# Patient Record
Sex: Male | Born: 1965 | Race: White | Hispanic: No | Marital: Married | Smoking: Never smoker
Health system: Southern US, Community
[De-identification: ages and names within clinical notes are randomized; demographics above are authoritative.]

## PROBLEM LIST (undated history)

## (undated) DIAGNOSIS — F419 Anxiety disorder, unspecified: Secondary | ICD-10-CM

## (undated) DIAGNOSIS — K219 Gastro-esophageal reflux disease without esophagitis: Secondary | ICD-10-CM

## (undated) DIAGNOSIS — K2270 Barrett's esophagus without dysplasia: Secondary | ICD-10-CM

## (undated) DIAGNOSIS — J45909 Unspecified asthma, uncomplicated: Secondary | ICD-10-CM

## (undated) DIAGNOSIS — I201 Angina pectoris with documented spasm: Secondary | ICD-10-CM

## (undated) DIAGNOSIS — E119 Type 2 diabetes mellitus without complications: Secondary | ICD-10-CM

## (undated) DIAGNOSIS — J302 Other seasonal allergic rhinitis: Secondary | ICD-10-CM

## (undated) DIAGNOSIS — IMO0002 Reserved for concepts with insufficient information to code with codable children: Secondary | ICD-10-CM

## (undated) DIAGNOSIS — F32A Depression, unspecified: Secondary | ICD-10-CM

## (undated) DIAGNOSIS — I2510 Atherosclerotic heart disease of native coronary artery without angina pectoris: Secondary | ICD-10-CM

## (undated) DIAGNOSIS — L988 Other specified disorders of the skin and subcutaneous tissue: Secondary | ICD-10-CM

## (undated) DIAGNOSIS — K449 Diaphragmatic hernia without obstruction or gangrene: Secondary | ICD-10-CM

## (undated) DIAGNOSIS — E669 Obesity, unspecified: Secondary | ICD-10-CM

## (undated) DIAGNOSIS — F329 Major depressive disorder, single episode, unspecified: Secondary | ICD-10-CM

## (undated) DIAGNOSIS — K259 Gastric ulcer, unspecified as acute or chronic, without hemorrhage or perforation: Secondary | ICD-10-CM

## (undated) DIAGNOSIS — G4730 Sleep apnea, unspecified: Secondary | ICD-10-CM

## (undated) DIAGNOSIS — Z8679 Personal history of other diseases of the circulatory system: Secondary | ICD-10-CM

## (undated) DIAGNOSIS — I219 Acute myocardial infarction, unspecified: Secondary | ICD-10-CM

## (undated) DIAGNOSIS — J189 Pneumonia, unspecified organism: Secondary | ICD-10-CM

## (undated) HISTORY — PX: CHOLECYSTECTOMY: SHX55

## (undated) HISTORY — DX: Diaphragmatic hernia without obstruction or gangrene: K44.9

## (undated) HISTORY — DX: Type 2 diabetes mellitus without complications: E11.9

## (undated) HISTORY — DX: Atherosclerotic heart disease of native coronary artery without angina pectoris: I25.10

## (undated) HISTORY — DX: Unspecified asthma, uncomplicated: J45.909

## (undated) HISTORY — DX: Personal history of other diseases of the circulatory system: Z86.79

## (undated) HISTORY — PX: CARDIAC CATHETERIZATION: SHX172

## (undated) HISTORY — DX: Other seasonal allergic rhinitis: J30.2

## (undated) HISTORY — DX: Angina pectoris with documented spasm: I20.1

## (undated) HISTORY — PX: FLEXIBLE SIGMOIDOSCOPY: SHX1649

## (undated) HISTORY — PX: OTHER SURGICAL HISTORY: SHX169

## (undated) HISTORY — PX: CLEFT LIP REPAIR: SUR1164

## (undated) HISTORY — DX: Acute myocardial infarction, unspecified: I21.9

## (undated) HISTORY — DX: Obesity, unspecified: E66.9

## (undated) HISTORY — DX: Anxiety disorder, unspecified: F41.9

## (undated) HISTORY — DX: Pneumonia, unspecified organism: J18.9

## (undated) HISTORY — PX: ESOPHAGOGASTRODUODENOSCOPY: SHX1529

## (undated) HISTORY — DX: Gastric ulcer, unspecified as acute or chronic, without hemorrhage or perforation: K25.9

## (undated) HISTORY — DX: Sleep apnea, unspecified: G47.30

## (undated) HISTORY — DX: Gastro-esophageal reflux disease without esophagitis: K21.9

## (undated) HISTORY — PX: TONSILLECTOMY: SUR1361

## (undated) HISTORY — DX: Other specified disorders of the skin and subcutaneous tissue: L98.8

## (undated) HISTORY — DX: Barrett's esophagus without dysplasia: K22.70

## (undated) HISTORY — DX: Major depressive disorder, single episode, unspecified: F32.9

## (undated) HISTORY — DX: Depression, unspecified: F32.A

## (undated) HISTORY — PX: CLEFT PALATE REPAIR: SUR1165

## (undated) HISTORY — DX: Reserved for concepts with insufficient information to code with codable children: IMO0002

## (undated) HISTORY — DX: Morbid (severe) obesity due to excess calories: E66.01

---

## 2006-11-02 ENCOUNTER — Emergency Department (HOSPITAL_COMMUNITY): Admission: EM | Admit: 2006-11-02 | Discharge: 2006-11-02 | Payer: Self-pay | Admitting: Emergency Medicine

## 2007-09-06 ENCOUNTER — Observation Stay (HOSPITAL_COMMUNITY): Admission: EM | Admit: 2007-09-06 | Discharge: 2007-09-07 | Payer: Self-pay | Admitting: Emergency Medicine

## 2007-09-06 ENCOUNTER — Encounter (INDEPENDENT_AMBULATORY_CARE_PROVIDER_SITE_OTHER): Payer: Self-pay | Admitting: Emergency Medicine

## 2007-09-06 ENCOUNTER — Ambulatory Visit: Payer: Self-pay | Admitting: Internal Medicine

## 2007-09-13 ENCOUNTER — Ambulatory Visit (HOSPITAL_COMMUNITY): Admission: RE | Admit: 2007-09-13 | Discharge: 2007-09-13 | Payer: Self-pay | Admitting: *Deleted

## 2007-10-20 ENCOUNTER — Observation Stay (HOSPITAL_COMMUNITY): Admission: EM | Admit: 2007-10-20 | Discharge: 2007-10-21 | Payer: Self-pay | Admitting: Emergency Medicine

## 2009-10-19 IMAGING — CR DG CHEST 1V PORT
1 series · 1 of 1 positions shown · non-contrast
Comparison: None.

CLINICAL DATA: Chest pain.  Short of breath.
 PORTABLE CHEST, ONE VIEW ? 09/06/2007 ? (1666 HOURS):

[view not recorded]
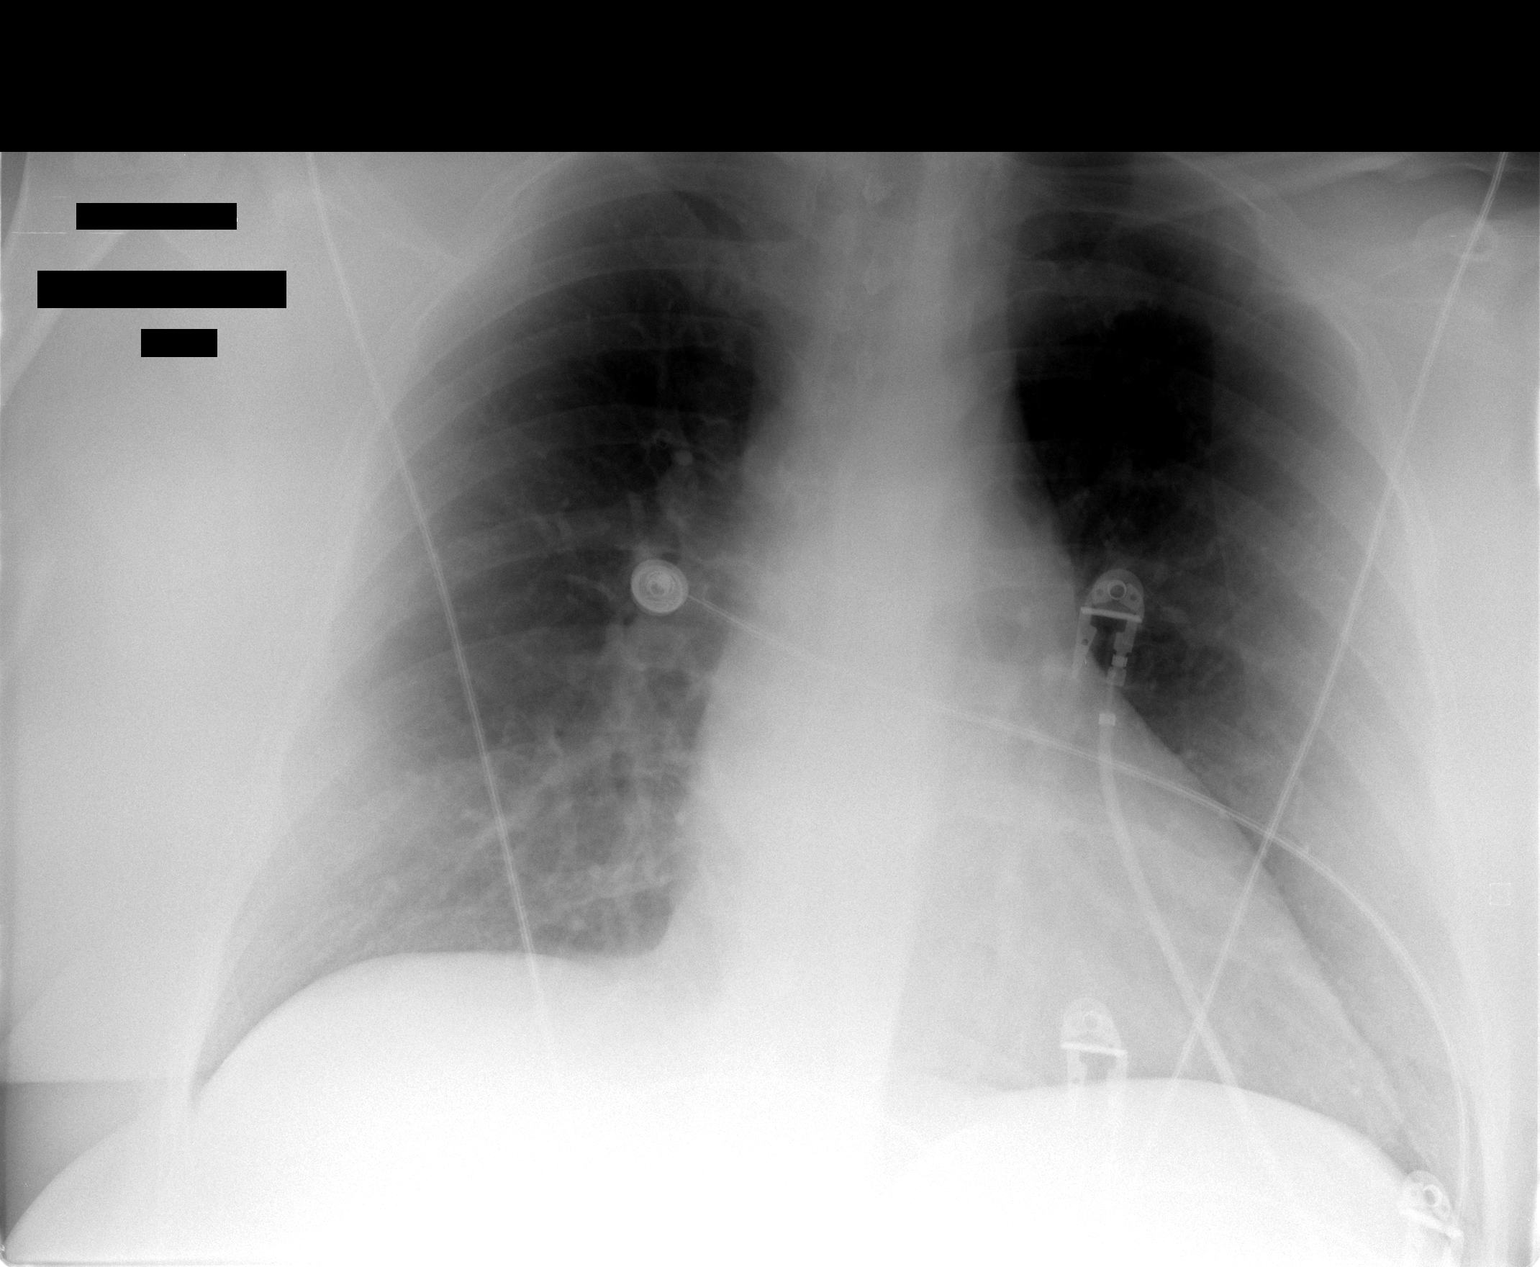

[1 of 1 positions shown; findings below may reference images not displayed]

FINDINGS: The heart is upper normal in size.  There is no heart failure and the lungs are clear.  There is no infiltrate or effusion.
IMPRESSION: Cardiac enlargement.  No active cardiopulmonary disease.

## 2011-03-15 NOTE — Discharge Summary (Signed)
NAME:  Henry Vasquez, Henry Vasquez             ACCOUNT NO.:  [id number]   MEDICAL RECORD NO.:  [id number]          PATIENT TYPE:  INP   LOCATION:  3731                         FACILITY:  [facility]   PHYSICIAN:  Vesta Mixer, M.D. DATE OF BIRTH:  03/06/1966   DATE OF ADMISSION:  10/20/2007  DATE OF DISCHARGE:  10/21/2007                               DISCHARGE SUMMARY   DISCHARGE DIAGNOSES:  1. Acute pericarditis.  2. History of Prinzmetal's angina.  3. Diabetes mellitus.  4. Morbid obesity.   DISCHARGE MEDICATIONS:  1. Aspirin 162 mg a day.  2. Motrin 400 mg 2-3 times a day.  3. Colchicine 0.6 mg twice a day as needed.  4. Actos 30 mg a day.  5. Lantus 65 units subcutaneously at night.  6. Lexapro 40 mg a day.  7. Lisinopril 20 mg a day.  8. Glucophage 1000 mg p.o. b.i.d.  9. Imdur 60 mg a day.  10.Verapamil SR 120 mg a day.   DISPOSITION:  The patient will see Dr. Reyes Ivan this week.   HISTORY:  Mr. Harriott is a 45 year old gentleman with recent onset of  chest pain.  He presented to the emergency room with more chest pain  last night.  Please please see dictated H&P for further details.   HOSPITAL COURSE BY PROBLEM:  Problem 1.  Chest pain.  The patient was  recently diagnosed as having Prinzmetal's angina.  He has been treated  with Imdur and was recently started on verapamil, but his chest pain has  continued.  He presented to the ER last night where was found to have PR  depression with ST-segment elevation in a diffuse manner consistent with  pericarditis.  He was started on colchicine and Motrin, with resolution  of his chest pain.  This morning, his EKG is improved but still shows a  little bit of ST-segment elevation.  He will be discharged on the above-  noted medications and will follow up with Dr. Reyes Ivan.  He will need a  standard evaluation for pericarditis including workup for collagen  vascular disease.  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  Further workup per Dr. Reyes Ivan.          ______________________________  Vesta Mixer, M.D.    PJN/MEDQ  D:  10/21/2007  T:  10/22/2007  Job:  [id number]   cc:   Elmore Guise., M.D.

## 2011-03-15 NOTE — H&P (Signed)
NAME:  Henry Vasquez, BUTNER NO.:  [id number]   MEDICAL RECORD NO.:  [id number]          PATIENT TYPE:  INP   LOCATION:  3704                         FACILITY:  [facility]   PHYSICIAN:  Elmore Guise., M.D.DATE OF BIRTH:  Oct 17, 1966   DATE OF ADMISSION:  09/06/2007  DATE OF DISCHARGE:  09/07/2007                              HISTORY & PHYSICAL   DATE OF PROCEDURE:  Thursday, November 13th.   PROCEDURE:  Cardiac catheterization with possible intervention.   INDICATION FOR PROCEDURE:  Exertional chest pain and shortness of  breath.   HISTORY OF PRESENT ILLNESS:  Mr. Heggs is a very pleasant 45 year old  white male, past medical history of diabetes mellitus, hypertension,  obesity, who was admitted to the hospital on November 6th for chest  pain.  At that time, he had an abnormal EKG and felt to have  pericarditis.  He was treated with ibuprofen over the weekend, however,  his symptoms have continued.  He now reports increasing exertional  dyspnea.  He can walk less than a block before he gets totally winded  and tired.  He now states his chest discomfort  is a pressure.  It is  worse with activity, improve with rest.  Denies any positional changes.  While he was admitted to the hospital, he did get nitroglycerin with  improvement in his symptoms.  His pain is left-sided, associated with  shortness of breath and diaphoresis, but no nausea.  His last cardiac  workup was at [facility] approximately 6 years ago, and at that  time checked out okay.  With his last hospital admission, he was ruled  out and told to follow up for further evaluation.  Over the weekend he  did well, however, he continued with his exertional symptoms.  He denies  any recent fever or cough.  No malaise.  No problems with recent virus  or infection.  He states his sugar has been running a little bit on the  higher side.  He has had an approximately 5-pound or 10-pound weight  gain because  he can not do the activities he would like to do.  He does  work as a Doctor, general practice at [employer].  He can walk from his car to  the front door, and he will be significantly short of breath.  However,  now, when he walks from his car to the front door, not only will he have  shortness of breath, but also chest discomfort.  On arrival to his  office, he will be totally out of breath with chest discomfort and has  to sit down for 5 or 10 minutes before he is able to resume his  activities.   REVIEW OF SYSTEMS:  Are as per HPI.  All others are negative.   CURRENT MEDICATIONS:  1. Lantus 65 units nightly.  2. Metformin 1000 mg twice daily.  3. Actos 30 mg daily.  4. Lisinopril 20 mg daily.  5. Lexapro 40 mg daily.  6. Nexium 40 mg daily.  7. Aspirin 81 mg daily.  8. Ibuprofen p.r.n.   ALLERGIES:  None.  FAMILY HISTORY:  Positive for heart disease with his grandmother and  grandfather.  His mother has hypertension.  His father's family history  is unknown.   SOCIAL HISTORY:  He is married.  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  No tobacco, rare alcohol intake, and occasional caffeine intake.   PAST SURGICAL HISTORY:  1. Cleft palate repair.  2. Tonsillectomy.   PHYSICAL EXAMINATION:  8is weight is 280 pounds, blood pressure 116/64 .  His heart rate is 88 and regular.  GENERAL:  He is a very pleasant middle-aged white male, alert and  oriented x4.  No acute distress.  HEENT:  Repaired cleft palate.  NECK:  Supple.  No lymphadenopathy.  2+ carotids.  No JVD and no bruits.  LUNGS:  Clear.  HEART:  Regular with normal S1, S2.  Soft flow murmur noted.  ABDOMEN:  Obese, soft, nontender, nondistended.  No rebound or guarding.  EXTREMITIES:  Warm with 2+ pedal pulses and no edema.  Femoral pulse is  trace and deep.   His blood work from his hospitalization was reviewed and showed a white  blood cell count of 7.3, a hemoglobin of 14.7, platelet count of 335.  He had a PT  and INR of  13.1 and 1, with a PTT of 30.  He had a  potassium level 4, a BUN and creatinine of 10 and 0.7.  Normal LFTs.  His cardiac markers were negative.  His lipids showed a total  cholesterol 132, triglycerides 128, HDL 31, LDL 75.  His TSH was normal  at 0.87, and his hemoglobin A1c was 7.2.  His EKG shows normal sinus  rhythm, normal axis, normal intervals with mild J-point elevation  throughout his limb and reported leads.   IMPRESSION:  1. Continued exertional chest tightness consistent with angina.  2. Diabetes mellitus.  3. Borderline hypertension.  4. Dyslipidemia (low HDL).   PLAN:  The patient's symptoms have continued.  He is not able to do his  normal activities because of his significant limitations.  He did have  an echocardiogram done during his last hospitalization which showed an  EF of approximately 60% with no wall motion abnormalities.  He did have  mild LVH.  His diastolic function was not noted.  He had no significant  valvular heart disease.  Because of his symptoms, I have recommended  continued aspirin.  I did write him a prescription for nitroglycerin to  use on a p.r.n. basis.  We will also start Ranexa 500 mg twice daily.  His QT was normal.  He will be set up for cardiac catheterization with  possible intervention.  I have discussed the risks and benefits with him  at length, and he agrees to proceed.  All his questions were answered.      Elmore Guise., M.D.  Electronically Signed     TWK/MEDQ  D:  09/10/2007  T:  09/11/2007  Job:  [id number]

## 2011-03-15 NOTE — H&P (Signed)
NAME:  DYER, KLUG NO.:  [id number]   MEDICAL RECORD NO.:  [id number]          PATIENT TYPE:  INP   LOCATION:  3731                         FACILITY:  [facility]   PHYSICIAN:  Vesta Mixer, M.D. DATE OF BIRTH:  [birth year]   DATE OF ADMISSION:  10/20/2007  DATE OF DISCHARGE:                              HISTORY & PHYSICAL   HISTORY OF PRESENT ILLNESS:  Henry Vasquez is a 45 year old gentleman  with a history of diabetes mellitus and coronary spasm.  He is admitted  with chest pain.   The patient has a history of chest pain for the past several months.  He  had a heart catheterization last month which revealed coronary spasm of  the proximal right after engagement of the right.  It resolved with  sublingual nitroglycerin.  He was seen in the office last week with more  chest pain.  He had Verapamil added.  The patient presents to the  hospital today with persistent chest pain.  It was not completely  relieved with sublingual nitroglycerin.  He is now admitted for further  evaluation.   CURRENT MEDICATIONS:  1. Actos 30 mg a day.  2. Lantus insulin 65 units at night.  3. Lexapro 40 mg a day.  4. Lisinopril 20 mg a day.  5. Metformin 1000 mg p.o. b.i.d.  6. Nexium 40 mg a day.  7. Imdur 60 mg a day.  8. Verapamil SR 120 mg a day.   ALLERGIES:  NO KNOWN DRUG ALLERGIES.   PAST MEDICAL HISTORY:  1. Diabetes mellitus.  2. 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   SOCIAL HISTORY:  The patient is a nonsmoker and nondrinker.  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   FAMILY HISTORY:  Positive for coronary disease in his grandfather.   REVIEW OF SYSTEMS:  The patient denies any significant prolonged  episodes of chest pain.  He denies any syncope or presyncope.  He denies  any shortness breath.   PHYSICAL EXAMINATION:  GENERAL:  He is a middle-aged gentleman in no  acute distress.  He is alert and oriented x3 and his mood and affect are   normal.  VITAL SIGNS:  Blood pressure 113/59 with heart rate of 77.  HEENT:  2+ carotids, no bruits, no JVD, no thyromegaly.  LUNGS:  Clear to auscultation.  HEART:  Regular rate, S1, S2, no rub.  ABDOMEN:  Good bowel sounds and nontender.  EXTREMITIES:  No clubbing, cyanosis or edema.  NEUROLOGIC:  Nonfocal.   LABORATORY DATA AND X-RAY FINDINGS:  Cardiac enzymes are negative x2.  Electrolytes normal.   His EKG reveals normal sinus rhythm.  He has PR segment depression with  ST-segment elevation consistent with pericarditis.   IMPRESSION/PLAN:  Chest pain.  I suspect the patient has acute  pericarditis.  Will give him some colchicine as well as some Motrin.  Will keep him in the hospital since he does have a history of coronary  spasm, but I suspect we will be able to stop the nitroglycerin soon.  We  will add nonsteroidal anti-inflammatory agents to his  discharge  medications.  I anticipate sending him home tomorrow.           ______________________________  Vesta Mixer, M.D.     PJN/MEDQ  D:  10/20/2007  T:  10/22/2007  Job:  [id number]

## 2011-03-15 NOTE — Consult Note (Signed)
NAME:  Henry Vasquez, Henry Vasquez NO.:  [id number]   MEDICAL RECORD NO.:  [id number]          PATIENT TYPE:  INP   LOCATION:  3704                         FACILITY:  [facility]   PHYSICIAN:  Cassell Clement, M.D. DATE OF BIRTH:  01/15/1966   DATE OF CONSULTATION:  09/07/2007  DATE OF DISCHARGE:  09/07/2007                                 CONSULTATION   REFERRING PHYSICIAN:  Ileana Roup, M.D.   CHIEF COMPLAINT:  Chest pain.   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  He was admitted on September 06, 2007  because of chest pain.  He was admitted on the Teaching Service.  He  gives a history of longstanding adult-onset diabetes mellitus and a  history of morbid obesity; he weighs in the neighborhood of 285 pounds.  He develop chest discomfort yesterday and he came to the emergency room,  where his initial evaluation was unremarkable, but because of the  history, he was admitted.  He gives a history of having had a cardiac  workup at [facility] approximately 6 years ago including a stress  test, which was negative at that time.   PAST MEDICAL HISTORY:  The patient has a history of:  1. Hypertension.  2. Depression.  3. Diabetes mellitus.   PRESENT MEDICINES AT HOME:  Include Actos, Lantus, Lexapro, lisinopril,  metformin and Nexium.  He is not on statin therapy, but has  intrinsically very low lipid levels.  He is a nonsmoker.   FAMILY HISTORY:  Positive for heart disease in his mother.  Father's  health is unknown.   SOCIAL HISTORY:  Social history reveals that he is married.  He has  never smoked.  He is a Public affairs consultant at [employer].  He lives with  his wife.   LABORATORY AND ACCESSORY CLINICAL DATA:  The initial cardiac workup has  been negative including a two-dimensional echocardiogram done yesterday  which shows overall left ventricular systolic function normal at 60%  with no regional wall  motion abnormalities and with mild to moderate LV  wall thickening, presumably from his essential hypertension.  There was  no evidence of pericardial thickening or pericardial effusion.   His cardiac enzymes have been negative x3.   His electrocardiograms have shown slight diffuse ST elevation of the ST  segment in the anterior and inferior lead suggesting possible mild  pericarditis by EKG.   PHYSICAL EXAMINATION:  VITAL SIGNS:  On exam today, vital signs are  stable.  CHEST:  Clear.  HEART:  Reveals no pericardial rub, no gallop or murmur.  ABDOMEN:  Obese and nontender.  EXTREMITIES:  Show no phlebitis or edema.  He estimates his weight at  about 285.   IMPRESSION:  Possible mild pericarditis.  No evidence for acute  myocardial infarction.   RECOMMENDATIONS:  I believe that this being Friday, it will be okay to  discharge him today on empiric therapy with ibuprofen 400 mg three times  a day with meals and also on an 81-mg aspirin daily and treat him over  the weekend for a possible low-grade pericarditis versus nonspecific  chest wall pain.  We have made him an appointment to see my associate,  Dr. Lady Deutscher, Monday afternoon in the office of 2:15.  He is to be  resting at home until then.           ______________________________  Cassell Clement, M.D.     TB/MEDQ  D:  09/07/2007  T:  09/08/2007  Job:  [id number]   cc:   Ileana Roup, M.D.  Elmore Guise., M.D.

## 2011-03-15 NOTE — Cardiovascular Report (Signed)
NAME:  Henry Vasquez, Henry Vasquez NO.:  [id number]   MEDICAL RECORD NO.:  [id number]          PATIENT TYPE:  OIB   LOCATION:  2853                         FACILITY:  [facility]   PHYSICIAN:  Elmore Guise., M.D.DATE OF BIRTH:  29-Sep-1966   DATE OF PROCEDURE:  09/13/2007  DATE OF DISCHARGE:  09/13/2007                            CARDIAC CATHETERIZATION   INDICATIONS FOR PROCEDURE:  Continued exertional chest pain and  shortness of breath.  Patient with multiple cardiac risk factors,  evaluate for ischemic heart disease.   DESCRIPTION OF PROCEDURE:  The patient was brought to the cardiac cath  lab after appropriate informed consent.  He was prepped and draped in  sterile fashion.  Approximately 15 mL of 1% lidocaine was used for local  anesthesia.  A 5-French sheath was placed in the right femoral artery  with the help of a Smart needle. Coronary angiography and LV angiography  were performed.  The patient was noted to have vasospasm of his proximal  right coronary and this resolved with 200 mcg of intracoronary  nitroglycerin.  The patient tolerated the procedure well and was  transferred from the cardiac cath lab in stable condition.   FINDINGS:  1. Left Main:  Normal.  2. LAD:  Moderate-sized vessel with mild luminal irregularities.  3. D1/D2:  Small vessels with mild luminal irregularities.  4. LC X:  Nondominant giving one large OM vessel both with mild      luminal irregularities.  Ramus intermedius:  Large vessel with mild      luminal irregularities.  RCA:  Dominant, proximal vasospasm noted.      This had total resolution with 200 mcg of intracoronary      nitroglycerin otherwise mild luminal irregularities were noted.  5. LV:  EF is 60%.  No wall motion abnormalities.  LVEDP is 17 mmHg.   IMPRESSION:  1. Nonobstructive coronary arteries with vasospasm of the proximal      right coronary artery with total resolution with 200 mcg of      intracoronary  nitroglycerin.  2. Preserved left ventricular systolic function, ejection fraction of      60% and no wall motion abnormalities.   PLAN:  At this time, I would recommend aggressive risk factor  modification as indicated.  He will have sublingual nitroglycerin for  chest pain and we will continue his Renexa at this time.  Should he have  to take nitroglycerin more than twice per week, we will start long-  acting nitrates.      Elmore Guise., M.D.  Electronically Signed     TWK/MEDQ  D:  09/13/2007  T:  09/13/2007  Job:  [id number]

## 2011-03-18 NOTE — Discharge Summary (Signed)
NAME:  Henry Vasquez, Henry Vasquez NO.:  [id number]   MEDICAL RECORD NO.:  [id number]          PATIENT TYPE:  OBV   LOCATION:  3704                         FACILITY:  [facility]   PHYSICIAN:  Ileana Roup, M.D.  DATE OF BIRTH:  15-Jul-1966   DATE OF ADMISSION:  09/06/2007  DATE OF DISCHARGE:  09/07/2007                               DISCHARGE SUMMARY   DISCHARGE DIAGNOSES:  1. Chest pain.  2. Mild transaminitis.  3. Dyslipidemia to include low HDL.  4. Diabetes.  5. Obesity.  6. Depression.  7. Hypertension.   DISCHARGE MEDICATIONS:  1. Ibuprofen 400 mg t.i.d.  2. Aspirin 81 mg daily.  3. Actos 30 mg daily.  4. Lantus 65 units nightly.  5. Lexapro 20 mg daily.  6. Lisinopril 20 mg daily.  7. Metformin 1000 mg b.i.d.  8. Nexium 40 mg daily.  9. Darvocet-N 100 one tablet every 4 hours, 20 tablets dispensed.   DISPOSITION AND FOLLOW UP:  The patient is to follow up with Dr. Reyes Ivan  on September 10, 2007 at 2:15 p.m.  The patient is also to follow up with  [facility] after discharge and repeat liver enzymes need to be  checked.   PROCEDURES PERFORMED:  An echocardiogram was performed during this  hospitalization and indicative of an ejection fraction of 60%.  There  were no left ventricular regional wall motion abnormalities.  The left  ventricular wall thickness was mildly to moderately increased.   CONSULTATIONS:  Cardiology was consulted secondary to the patient's  chest pain and risk factors.  Dr. Patty Sermons felt like the patient's pain  might be secondary to mild pericarditis and recommended treatment with  ibuprofen t.i.d.   ADMITTING HISTORY AND PHYSICAL:  The patient is a 45 year old gentleman  with a past medical history of diabetes type 2 and obesity who presents  with left-sided chest pain that was associated with shortness of breath.  It lasted approximately 2 minutes and radiated to his left neck.  The  patient states he has been having this pain  off and on for about a week.  The morning of admission was the first time he had pain with rest.  The  pain is not associated with diaphoresis, nausea, vomiting or dizziness.  It usually happens with exertion and it gets better with rest.   ADMISSION VITALS:  Temperature 97.3, blood pressure 127/76, pulse 89,  respiratory rate 20, O2 saturation 97% on room air.   ADMISSION PHYSICAL EXAM:  GENERAL:  The patient is pleasant, alert and  oriented, in no distress.  EYES:  Anicteric, extraocular motions intact and pupils were equally  round and reactive to light.  ENT:  Moist mucous membranes.  Scar from a cleft palate repair noted.  NECK:  Supple, no lymphadenopathy, no carotid bruits.  RESPIRATORY:  Clear to auscultation bilaterally.  CARDIOVASCULAR:  Regular rate and rhythm, no murmurs, rubs or gallops.  Chest pain not reproducible with palpation.  ABDOMEN:  Soft, obese, positive bowel sounds, nontender, nondistended.  EXTREMITIES:  No edema.  NEURO:  Grossly intact.  No cerebellar signs.   ADMISSION  LABORATORY:  Cardiac enzymes within normal limits.  CBC:  White blood cell count 7.3, hemoglobin 14.7, MCV 84.7, RDW 13.5,  platelet count 335.  PT/INR 13.1 and 7, PTT 30.  CMET:  Sodium 140,  potassium 4, chloride 103, bicarb 28, BUN 10, creatinine 0.78, glucose  130, total bilirubin 0.6, alkaline phosphatase 54, AST 45, ALT 71, total  protein 6.5, albumin 3.9, calcium 9.1.  Urine drug screen within normal  limits.   HOSPITAL COURSE:  1. Chest pain.  EKG was done during this hospitalization and cardiac      enzymes were cycled.  Cardiac enzymes remained within normal limits      and an EKG showed some possible ST segment elevation in all leads.      Cardiology was consulted and felt like the patient may have a mild      pericarditis and was started on therapy with ibuprofen 400 mg      t.i.d.  A TSH was also checked during this hospitalization and was      within normal limits at 0.872.   Urine drug screen was negative.  A      lipid profile was checked and the patient's LDL was 75 and HDL was      low at 31.  An echo was done during this hospitalization and      indicative of a normal ejection fraction and no ventricular wall      motion abnormalities.  The left ventricular wall thickness was      mildly to moderately increased.  The patient will follow up with      Dr. Yevonne Pax office as an outpatient.  2. Mild transaminitis.  On admission the patient's liver enzymes were      noted to be mildly elevated with an AST of 45 and ALT of 71.  The      patient will need this repeated and followed up as an outpatient.  3. Diabetes mellitus type 2.  Hemoglobin A1c was checked during this      hospitalization and found to be 7.2.  The patient was placed on      sliding scale insulin and good glycemic control was achieved.  4. Depression.  The patient's home medications were continued and his      mood remained stable during this hospitalization.  5. Dyslipidemia.  A lipid panel was checked and the patient's LDL was      good at 75 however the patient had a low HDL at 31.  The patient      would likely benefit from niacin therapy and Cardiology will follow      this as an outpatient.  6. Hypertension.  The patient was started on lisinopril and was      normotensive throughout this hospitalization.   DISCHARGE VITALS:  Temperature 96.8, blood pressure 112/69, pulse 82,  respiratory rate 20, O2 saturation 96% on 2 liters.   DISCHARGE LABORATORY:  CBC:  White blood cell count 6.7, hemoglobin  13.6, platelet count 303.  BMET:  Sodium 140, potassium 3.9, chloride  103, bicarb 31, BUN 12, creatinine 0.9, glucose 110, and calcium 9.      Joaquin Courts, MD  Electronically Signed      Ileana Roup, M.D.  Electronically Signed    VW/MEDQ  D:  11/12/2007  T:  11/13/2007  Job:  [id number]

## 2011-08-05 LAB — CK TOTAL AND CKMB (NOT AT ARMC)
CK, MB: 0.9
CK, MB: 0.9
Relative Index: INVALID
Total CK: 99

## 2011-08-05 LAB — BASIC METABOLIC PANEL
Calcium: 8.9
Chloride: 105
Creatinine, Ser: 0.99
GFR calc Af Amer: >60
GFR calc non Af Amer: >60
Sodium: 142

## 2011-08-05 LAB — POCT CARDIAC MARKERS
CKMB, poc: <1 — ABNORMAL LOW
Myoglobin, poc: 61.5
Myoglobin, poc: 69

## 2011-08-05 LAB — I-STAT 8, (EC8 V) (CONVERTED LAB)
Acid-Base Excess: 1
BUN: 13
Bicarbonate: 26.7 — ABNORMAL HIGH
Chloride: 104
Hemoglobin: 15
Operator id: 196461
pH, Ven: 7.373 — ABNORMAL HIGH

## 2011-08-05 LAB — CBC
Hemoglobin: 13.6
Hemoglobin: 14.1
MCHC: 33.9
MCV: 85.6
RDW: 13.7

## 2011-08-05 LAB — DIFFERENTIAL
Basophils Absolute: 0.1
Basophils Relative: 1
Lymphocytes Relative: 29
Monocytes Absolute: 0.6
Monocytes Relative: 8
Neutro Abs: 4.7
Neutrophils Relative %: 61

## 2011-08-05 LAB — POCT I-STAT CREATININE: Creatinine, Ser: 0.8

## 2011-08-09 LAB — I-STAT 8, (EC8 V) (CONVERTED LAB)
BUN: 11
HCT: 46
Hemoglobin: 15.6
Operator id: 288831
Sodium: 136
TCO2: 28

## 2011-08-09 LAB — LIPID PANEL
Cholesterol: 132
HDL: 31 — ABNORMAL LOW
LDL Cholesterol: 75
Total CHOL/HDL Ratio: 4.3
Triglycerides: 128

## 2011-08-09 LAB — CK TOTAL AND CKMB (NOT AT ARMC)
Relative Index: INVALID
Total CK: 119

## 2011-08-09 LAB — BASIC METABOLIC PANEL
BUN: 11
CO2: 31
Chloride: 103
Chloride: 105
Creatinine, Ser: 0.89
GFR calc Af Amer: >60
GFR calc non Af Amer: >60
Glucose, Bld: 87
Potassium: 3.7
Potassium: 3.9

## 2011-08-09 LAB — COMPREHENSIVE METABOLIC PANEL
ALT: 71 — ABNORMAL HIGH
Alkaline Phosphatase: 54
BUN: 10
Creatinine, Ser: 0.78
GFR calc Af Amer: >60
Potassium: 4
Sodium: 140
Total Protein: 6.5

## 2011-08-09 LAB — BASIC METABOLIC PANEL WITH GFR
CO2: 29
Calcium: 8.9
GFR calc Af Amer: >60
Sodium: 139

## 2011-08-09 LAB — URINALYSIS, MICROSCOPIC ONLY
Bilirubin Urine: NEGATIVE
Glucose, UA: NEGATIVE
Hgb urine dipstick: NEGATIVE
Protein, ur: NEGATIVE
Urobilinogen, UA: 0.2

## 2011-08-09 LAB — TROPONIN I
Troponin I: 0.01
Troponin I: 0.01
Troponin I: 0.02

## 2011-08-09 LAB — CBC
HCT: 39.5
HCT: 43.5
Hemoglobin: 13.6
Hemoglobin: 14.7
MCHC: 33.9
MCHC: 34.3
MCV: 84.4
MCV: 84.7
Platelets: 335
RBC: 4.69
RDW: 13.5
WBC: 6.7

## 2011-08-09 LAB — HEMOGLOBIN A1C: Hgb A1c MFr Bld: 7.2 — ABNORMAL HIGH

## 2011-08-09 LAB — DIFFERENTIAL
Eosinophils Absolute: 0.1
Lymphocytes Relative: 26
Lymphs Abs: 1.9
Monocytes Absolute: 0.5

## 2011-08-09 LAB — RAPID URINE DRUG SCREEN, HOSP PERFORMED
Amphetamines: NOT DETECTED
Barbiturates: NOT DETECTED
Cocaine: NOT DETECTED
Opiates: NOT DETECTED
Tetrahydrocannabinol: NOT DETECTED

## 2011-08-09 LAB — PROTIME-INR: Prothrombin Time: 13.1

## 2011-08-09 LAB — TSH: TSH: 0.872

## 2011-08-09 LAB — POCT I-STAT CREATININE: Creatinine, Ser: 0.8

## 2011-08-09 LAB — POCT CARDIAC MARKERS
Operator id: 288831
Troponin i, poc: <0.05

## 2011-10-01 DIAGNOSIS — I219 Acute myocardial infarction, unspecified: Secondary | ICD-10-CM

## 2011-10-01 HISTORY — DX: Acute myocardial infarction, unspecified: I21.9

## 2011-10-02 DIAGNOSIS — I219 Acute myocardial infarction, unspecified: Secondary | ICD-10-CM

## 2011-10-02 HISTORY — DX: Acute myocardial infarction, unspecified: I21.9

## 2013-04-09 ENCOUNTER — Ambulatory Visit (INDEPENDENT_AMBULATORY_CARE_PROVIDER_SITE_OTHER)
Payer: No Typology Code available for payment source | Admitting: Student in an Organized Health Care Education/Training Program

## 2013-04-09 ENCOUNTER — Encounter (INDEPENDENT_AMBULATORY_CARE_PROVIDER_SITE_OTHER): Payer: Self-pay | Admitting: Student in an Organized Health Care Education/Training Program

## 2013-04-09 VITALS — BP 119/84 | HR 99 | Temp 98.2°F | Ht 68.0 in | Wt 328.0 lb

## 2013-04-09 MED ORDER — PANTOPRAZOLE SODIUM 40 MG PO TBEC
40.0000 mg | DELAYED_RELEASE_TABLET | Freq: Every day | ORAL | Status: DC
Start: 2013-04-09 — End: 2013-07-05

## 2013-04-09 MED ORDER — INSULIN ASPART 100 UNIT/ML SC SOLN
60.00 [IU] | Freq: Three times a day (TID) | SUBCUTANEOUS | Status: DC
Start: 2013-04-09 — End: 2013-06-30

## 2013-04-09 MED ORDER — NITROGLYCERIN 0.4 MG SL SUBL
0.4000 mg | SUBLINGUAL_TABLET | SUBLINGUAL | Status: DC | PRN
Start: 2013-04-09 — End: 2013-12-27

## 2013-04-09 MED ORDER — METFORMIN HCL 850 MG PO TABS
850.00 mg | ORAL_TABLET | Freq: Three times a day (TID) | ORAL | Status: DC
Start: 2013-04-09 — End: 2013-07-05

## 2013-04-09 MED ORDER — VENLAFAXINE HCL 75 MG PO TABS
75.0000 mg | ORAL_TABLET | Freq: Two times a day (BID) | ORAL | Status: DC
Start: 2013-04-09 — End: 2013-07-05

## 2013-04-09 MED ORDER — ISOSORBIDE MONONITRATE ER 60 MG PO TB24
60.0000 mg | ORAL_TABLET | Freq: Every day | ORAL | Status: DC
Start: 2013-04-09 — End: 2013-07-05

## 2013-04-09 MED ORDER — INSULIN GLARGINE 100 UNIT/ML SC SOLN
SUBCUTANEOUS | Status: DC
Start: 2013-04-09 — End: 2013-06-30

## 2013-04-09 MED ORDER — AMLODIPINE BESYLATE 5 MG PO TABS
5.0000 mg | ORAL_TABLET | Freq: Every day | ORAL | Status: DC
Start: 2013-04-09 — End: 2013-07-16

## 2013-04-09 MED ORDER — ALBUTEROL SULFATE HFA 108 (90 BASE) MCG/ACT IN AERS
2.0000 | INHALATION_SPRAY | Freq: Four times a day (QID) | RESPIRATORY_TRACT | Status: AC | PRN
Start: 2013-04-09 — End: 2014-04-09

## 2013-04-09 MED ORDER — LISINOPRIL 2.5 MG PO TABS
2.5000 mg | ORAL_TABLET | Freq: Every day | ORAL | Status: DC
Start: 2013-04-09 — End: 2013-07-05

## 2013-04-09 NOTE — Progress Notes (Signed)
Subjective:   Brent Palmer is a 47 y.o. male with hypertension.     DM: diagnosed on 2004, taking lantus 110 bid, novolog 20-60 per meal carb count, trying to lose weight, taking metformin 850 mg tid, UTD with eye exam 01/2013, last A1C done on 12/2012 , UTD with vaccines but no records    HLD: taking amlodipine 5 mg qday, Lisinopril 2.5 mg qday    CAD/HTN: taking imdur qday lasix, amlodipine 5 mg qday , had "mild " AMI on 2012 , was seen by cardiology, has had card cath x 2 , was last seen 12/2012, not taking ASA    Pt c/o chest discomfort , burning sensation , has h/o barret esophagus , taking prilosec, sensations are triggered by foodm started few weeks ago    Has cleft palate repaired, has OSA using CPAP   Just moved in to the area, wife works at [employer]       Past Medical History   Diagnosis Date   . Diabetes mellitus    . Depression    . Anxiety    . GERD (gastroesophageal reflux disease)    . Morbid obesity    . Ulcer    . Seasonal allergies    . Asthma without status asthmaticus    . Heart attack 10/02/2011       No past surgical history on file.  History     Social History   . Marital Status: Married     Spouse Name: N/A     Number of Children: N/A   . Years of Education: N/A     Occupational History   . Not on file.     Social History Main Topics   . Smoking status: Not on file   . Smokeless tobacco: Not on file   . Alcohol Use: Not on file   . Drug Use: Not on file   . Sexually Active: Not on file     Other Topics Concern   . Not on file     Social History Narrative   . No narrative on file       Current Outpatient Prescriptions   Medication Sig Dispense Refill   . Albuterol Sulfate (PROAIR HFA IN) Inhale into the lungs.       Marland Kitchen amLODIPine (NORVASC) 5 MG tablet Take 5 mg by mouth daily.       Marland Kitchen dextrose ([employer] GLUCOSE) 40 % Gel Take 15 g by mouth as needed.       . furosemide (LASIX) 20 MG tablet Take 20 mg by mouth 2 (two) times daily.       . insulin aspart (NOVOLOG) 100 UNIT/ML injection Inject 60  Units into the skin 3 (three) times daily before meals.       . insulin glargine (LANTUS) 100 UNIT/ML injection Inject into the skin nightly.       . isosorbide mononitrate (IMDUR) 60 MG 24 hr tablet        . lisinopril (PRINIVIL,ZESTRIL) 2.5 MG tablet Take 2.5 mg by mouth daily.       . metFORMIN (GLUCOPHAGE) 850 MG tablet Take 850 mg by mouth 3 (three) times daily.       . nitroglycerin (NITROSTAT) 0.4 MG SL tablet Place 0.4 mg under the tongue every 5 (five) minutes as needed.       Marland Kitchen omeprazole (PRILOSEC) 20 MG capsule Take 20 mg by mouth daily.       . potassium chloride SA (K-DUR,KLOR-CON) 20  MEQ tablet Take 20 mEq by mouth 2 (two) times daily.       Marland Kitchen venlafaxine (EFFEXOR) 75 MG tablet Take 75 mg by mouth 2 (two) times daily.          Hypertension ROS: taking medications as instructed, no medication side effects noted, no TIA's, no chest pain on exertion, no dyspnea on exertion and no swelling of ankles.   New concerns: refills, establish care .   Constitutional: negative  Eyes: negative  Ears, nose, mouth, throat, and face: negative  Respiratory: negative  Cardiovascular: negative  Gastrointestinal: chest discomfort   Genitourinary:negative  Integument/breast: negative  Hematologic/lymphatic: negative  Musculoskeletal:negative  Neurological: negative  Behavioral/Psych: negative  Endocrine: negative      Objective:   BP 119/84  Pulse 99  Temp 98.2 F (36.8 C) (Oral)  Ht 1.727 m (5\' 8" )  Wt 148.78 kg (328 lb)  BMI 49.88 kg/m2  SpO2 98%   Appearance alert, well appearing, and in no distress.  CONSTITUTIONAL Patient is afebrile, Vital signs reviewed. normotensive.   HEAD Atraumatic, Normocephallc.   EYES Eyes are normal to inspection, PERRL, No discharge from eyes,   ENT throat scarring with cleft palate repaired    NECK Normal ROM, No jugular venous distention, No meningeal signs.   RESPIRATORY CHEST Chest is nontender, Breath sounds normal.   CARDIOVASCULAR RRR, Heart sounds normal, Normal S1 S2.    ABDOMEN Abdomen is nontender, No pulsatile masses, No other masses,   Bowel sounds normal, No distension, No peritoneal signs.   BACK There is no CVA Tenderness, There is no tenderness to palpation.   UPPER EXTREMITY Inspection normal, No cyanosis.   LOWER EXTREMITY Inspection normal, No cyanosis.   NEURO Cranial nerves intact, No cerebellar deficits, Normal DTRs, Babinski absent, Speech normal, Memory normal   SKIN Skin is warm, Skin is dry, Skin is normal color   LYMPHATIC No adenopathy in neck.   PSYCHIATRIC Oriented X 3, Normal affect. Normal insight.       Assessment:    1. DM (diabetes mellitus)  Hemoglobin A1c, Comprehensive metabolic panel, TSH, insulin aspart (NOVOLOG) 100 UNIT/ML injection, insulin glargine (LANTUS) 100 UNIT/ML injection, metFORMIN (GLUCOPHAGE) 850 MG tablet   2. CAD (coronary artery disease)  Lipid panel, TSH, Ambulatory referral to Cardiology, isosorbide mononitrate (IMDUR) 60 MG 24 hr tablet, nitroglycerin (NITROSTAT) 0.4 MG SL tablet   3. HTN (hypertension)  Comprehensive metabolic panel, Lipid panel, TSH, amLODIPine (NORVASC) 5 MG tablet, isosorbide mononitrate (IMDUR) 60 MG 24 hr tablet, lisinopril (PRINIVIL,ZESTRIL) 2.5 MG tablet   4. Barrett esophagus  Ambulatory referral to Gastroenterology, pantoprazole (PROTONIX) 40 MG tablet   5. Asthma, mild intermittent, uncomplicated  albuterol (PROAIR HFA) 108 (90 BASE) MCG/ACT inhaler   6. Depression  venlafaxine (EFFEXOR) 75 MG tablet     Check labs as above   Bring old records  Body mass index is 49.88 kg/(m^2).  Discussed the patient's BMI with him.  The BMI is above average; BMI management plan is completed  The patient is asked to make an attempt to improve diet and exercise patterns to aid in medical management of this problem.  Labs/Imaging: as above  Immunizations: as above  EKG: no acute changes   Eye Exam: UTD   Meds: no change   Monitor blood sugar at home.  Treatment Goals reviewed.   Discussed diabetic and low fat  diet.  Regular aerobic exercise.  FU: 3 months or sooner based on results.  F/u with cardiology, GI ,  referral generated   ASA after Sx get better

## 2013-04-10 ENCOUNTER — Encounter (INDEPENDENT_AMBULATORY_CARE_PROVIDER_SITE_OTHER): Payer: Self-pay | Admitting: Student in an Organized Health Care Education/Training Program

## 2013-04-10 ENCOUNTER — Encounter (INDEPENDENT_AMBULATORY_CARE_PROVIDER_SITE_OTHER): Payer: Self-pay

## 2013-04-10 LAB — COMPREHENSIVE METABOLIC PANEL
ALT: 71 U/L — ABNORMAL HIGH (ref 9–46)
AST (SGOT): 40 U/L (ref 10–40)
Albumin/Globulin Ratio: 1.9 (ref 1.0–2.5)
Albumin: 4.7 G/DL (ref 3.6–5.1)
Alkaline Phosphatase: 79 U/L (ref 40–115)
BUN: 16 MG/DL (ref 7–25)
Bilirubin, Total: 0.6 MG/DL (ref 0.2–1.2)
CO2: 24 mmol/L (ref 19–30)
Calcium: 9.9 MG/DL (ref 8.6–10.3)
Chloride: 101 mmol/L (ref 98–110)
Creatinine: 0.76 mg/dL (ref 0.60–1.35)
EGFR African American: 126 mL/min/{1.73_m2} (ref >=60)
EGFR: 109 mL/min/{1.73_m2} (ref >=60)
Globulin: 2.5 G/DL (ref 1.9–3.7)
Glucose: 118 MG/DL — ABNORMAL HIGH (ref 65–99)
Potassium: 4.9 mmol/L (ref 3.5–5.3)
Protein, Total: 7.2 G/DL (ref 6.1–8.1)
Sodium: 140 mmol/L (ref 135–146)

## 2013-04-10 LAB — LIPID PANEL
Cholesterol / HDL Ratio: 3.1 (ref 0.0–5.0)
Cholesterol: 145 MG/DL (ref 125–200)
HDL: 47 MG/DL (ref >=40)
LDL Calculated: 82 MG/DL (ref <130)
Non HDL Cholesterol (LDL and VLDL): 98 mg/dL
Triglycerides: 79 MG/DL (ref <150)

## 2013-04-10 LAB — TSH: TSH: 1.24 mIU/L (ref 0.40–4.50)

## 2013-04-10 LAB — HEMOGLOBIN A1C: Hemoglobin A1C: 6.9 % — ABNORMAL HIGH (ref <5.7)

## 2013-05-15 ENCOUNTER — Ambulatory Visit (INDEPENDENT_AMBULATORY_CARE_PROVIDER_SITE_OTHER): Payer: No Typology Code available for payment source | Admitting: Cardiovascular Disease

## 2013-05-15 ENCOUNTER — Encounter (INDEPENDENT_AMBULATORY_CARE_PROVIDER_SITE_OTHER): Payer: Self-pay | Admitting: Cardiovascular Disease

## 2013-05-15 VITALS — BP 104/70 | HR 84 | Ht 68.0 in | Wt 326.0 lb

## 2013-05-15 NOTE — Progress Notes (Signed)
[facility] CONSULTATION    I had the pleasure of seeing Mr. Brent Palmer today for cardiovascular evaluation. He is a pleasant 47 y.o. male with a history of hypertension, insulin-requiring diabetes, asthma, anxiety, and Prinzmetal's angina. He tells me that in 2008 he first developed chest discomfort symptoms. Eventually this led to a cardiac catheterization which revealed coronary spasm only. No significant coronary disease was noted.    He tells me that he was on medications including Imdur and amlodipine and had been doing well with that until he decided to that he did not need those medications in 2012. He subsequent work up in the middle of the night with chest discomfort symptoms. He went to emergent catheterization which again showed no significant coronary artery disease. He had no stent or angioplasty. He was off felt to be due to spasm. Apparently he did have positive troponins and was felt to have a non-Q wave myocardial infarction. Since this time, he has been compliant with his medications.    He does also have a history of Barrett's esophagitis and GERD. He frequently has mid chest discomfort related to meals which improves with belching. He is recently been started on Protonix to help. He's had EKGs during that time and no significant changes were noted. He does exercise doing walking about 2 days a week. He tells me that he only gets his heart rate up to about 70% of the maximum predicted this is afraid of straining his heart. With that he has no symptoms.    On his spasm medications, he has only had one or 2 episodes in the last several months. For those he's used an occasional sublingual nitroglycerin with relief. He has recently moved here from [facility] and is here to reestablish care. Unfortunately, I do not have any of his old records and the information is obtained from the patient.    He tells me that in 2005, he had a sleep study that did not show significant sleep  apnea. Since that time however he's gained a significant amount of weight. He tells me that his wife does report that he often stops breathing at night. He does have excessive daytime sleepiness and takes naps. 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    PAST MEDICAL HISTORY: He has a past medical history of Prinzmetal's angina, Diabetes mellitus; Depression; Anxiety; GERD (gastroesophageal reflux disease); Morbid obesity; Ulcer; Seasonal allergies; Asthma without status asthmaticus; Heart attack (10/02/2011); and Barrett esophagus. He has past surgical history that includes Cleft palate repair; Cardiac catheterization (2002, 2012); and Cholecystectomy.    MEDICATIONS:   Current Outpatient Prescriptions   Medication Sig Dispense Refill   . amLODIPine (NORVASC) 5 MG tablet Take 1 tablet (5 mg total) by mouth daily.  90 tablet  0   . dextrose ([employer] GLUCOSE) 40 % Gel Take 15 g by mouth as needed.       . furosemide (LASIX) 20 MG tablet Take 20 mg by mouth 2 (two) times daily.       . insulin aspart (NOVOLOG) 100 UNIT/ML injection Inject 60 Units into the skin 3 (three) times daily before meals.  17 mL  0   . insulin glargine (LANTUS) 100 UNIT/ML injection 110 units bid  20 mL  0   . isosorbide mononitrate (IMDUR) 60 MG 24 hr tablet Take 1 tablet (60 mg total) by mouth daily.  90 tablet  0   .  lisinopril (PRINIVIL,ZESTRIL) 2.5 MG tablet Take 1 tablet (2.5 mg total) by mouth daily.  90 tablet  0   . metFORMIN (GLUCOPHAGE) 850 MG tablet Take 1 tablet (850 mg total) by mouth 3 (three) times daily.  270 tablet  0   . nitroglycerin (NITROSTAT) 0.4 MG SL tablet Place 1 tablet (0.4 mg total) under the tongue every 5 (five) minutes as needed.  90 tablet  0   . omeprazole (PRILOSEC) 20 MG capsule Take 20 mg by mouth daily.       . pantoprazole (PROTONIX) 40 MG tablet Take 1 tablet (40 mg total) by mouth daily.  90 tablet  0   . venlafaxine (EFFEXOR) 75 MG tablet Take 1 tablet  (75 mg total) by mouth 2 (two) times daily.  90 tablet  0   . albuterol (PROAIR HFA) 108 (90 BASE) MCG/ACT inhaler Inhale 2 puffs into the lungs every 6 (six) hours as needed for Wheezing or Shortness of Breath.  1 Inhaler  0   . Albuterol Sulfate (PROAIR HFA IN) Inhale into the lungs.       . potassium chloride SA (K-DUR,KLOR-CON) 20 MEQ tablet Take 20 mEq by mouth 2 (two) times daily.           ALLERGIES: No Known Allergies    FAMILY HISTORY: His family history is not on file.    SOCIAL HISTORY: He does not have a smoking history on file. He does not have any smokeless tobacco history on file.    REVIEW OF SYSTEMS: All other systems reviewed and negative except as stated above.     PHYSICAL EXAMINATION  General Appearance:  A well-appearing male in no acute distress.    Vital Signs: BP 104/70  Pulse 84  Ht 1.727 m (5\' 8" )  Wt 147.873 kg (326 lb)  BMI 49.58 kg/m2   HEENT: Sclera anicteric, conjunctiva without pallor, moist mucous membranes, normal dentition. No arcus.   Neck:  Supple without jugular venous distention. Thyroid nonpalpable. Normal carotid upstrokes without bruits.   Chest: Clear to auscultation bilaterally with good air movement and respiratory effort and no wheezes, rales, or rhonchi   Cardiovascular: RRR, normal S1 and physiologically split S2 without murmurs, gallops or rub. PMI diffuse.  Abdomen: Soft, obese, nontender, nondistended, with normoactive bowel sounds. No organomegaly.  No pulsatile masses, or bruits.   Extremities: Warm without pitting edema, no clubbing, or cyanosis. All peripheral pulses are full and equal.   Skin: No rash, xanthoma or xanthelasma.   Neuro: Alert and oriented x3. Grossly intact. Strength is symmetrical. Normal mood and affect.     ECG: dated 04/09/13: Normal sinus rhythm, normal axis and intervals, nonspecific ST changes, no previous studies available for comparison.    LABS: dated 04/09/13: Total cholesterol 145, HDL 47, triglycerides 79, LDL 82, hemoglobin A1c  6.9%, glucose 118, BUN 16, creatinine 0.76, sodium 140, potassium 4.9, AST 40, ALT 71, alkaline phosphatase 79    IMPRESSION/RECOMMENDATIONS: Mr. Mantel is a 47 y.o. male with coronary artery disease risk factors including morbid obesity, sedentary behavior, hypertension, and insulin-requiring diabetes. Despite this, his lipids are actually quite good and he has not had significant coronary artery disease by 2 catheterizations at this point. It seems that his symptoms are consistent with coronary spasm. On medication, and his symptoms appear to be well controlled. As such, I did not make any changes. He certainly has room to adjust the medications if needed and could even add a very low dose Valium  which helps with coronary spasm as well. This might have the added benefit of health and anxiety.    I suspect that he has sleep apnea at this time. He's gained a significant amount of weight and is not sleeping well. I've asked him to complete a sleep study for further evaluation. I am going to request his records from his previous cardiologist in [place]. At that time, I would consider whether or not additional testing is necessary. At this time, I do not see a significant indication for a repeat stress test or echocardiogram. He tells me that his echocardiogram was one or 2 years ago as well. I will update these recommendations after I receive his laboratory studies.    He desperately needs to work on his diet, exercise, and weight loss. I suspect that his anxiety is holding him back from doing more exercise as he is concerned about having another heart attack. His heart attack was a little bit different from normal in that it was caused by coronary spasm. I suspect that there is some degree of plaque that is present, however. In the event that his catheterization shows that the atherosclerotic process has begun, I would likely recommend a statin. Otherwise, his lipids look quite good. I would only consider it given  his risk factors including diabetes.    PLAN:  1. Complete sleep study  2. Diet, exercise, and weight loss  3. Obtain records from previous cardiologist  4. Office visit in 6 months unless records indicate otherwise.

## 2013-05-15 NOTE — Patient Instructions (Signed)
1. Complete sleep study    2. Diet, exercise and weight loss    3. Will work on obtaining records from previous cardiologist.

## 2013-05-16 ENCOUNTER — Encounter (INDEPENDENT_AMBULATORY_CARE_PROVIDER_SITE_OTHER): Payer: Self-pay

## 2013-06-06 ENCOUNTER — Encounter (INDEPENDENT_AMBULATORY_CARE_PROVIDER_SITE_OTHER): Payer: Self-pay

## 2013-06-30 ENCOUNTER — Other Ambulatory Visit (INDEPENDENT_AMBULATORY_CARE_PROVIDER_SITE_OTHER): Payer: Self-pay | Admitting: Family Medicine

## 2013-06-30 ENCOUNTER — Telehealth (INDEPENDENT_AMBULATORY_CARE_PROVIDER_SITE_OTHER): Payer: Self-pay | Admitting: Family Medicine

## 2013-06-30 MED ORDER — INSULIN ASPART 100 UNIT/ML SC SOLN
60.0000 [IU] | Freq: Three times a day (TID) | SUBCUTANEOUS | Status: DC
Start: 2013-06-30 — End: 2013-07-05

## 2013-06-30 MED ORDER — INSULIN GLARGINE 100 UNIT/ML SC SOLN
SUBCUTANEOUS | Status: DC
Start: 2013-06-30 — End: 2013-07-05

## 2013-06-30 NOTE — Telephone Encounter (Signed)
Patient called stating that pharmacy needs clarification to release script.   After talking to pharmacy, they said a paper script was dropped off and suppose to be for a 3 month supply and it was written as 1 month. Last script in our system was June. Possibly written by a different provider. Patient was given a 1 month supply per pharmacy and will need to contact provider who wrote script on Tuesday.

## 2013-06-30 NOTE — Telephone Encounter (Signed)
Patient called back. States he wouldn't accept the smaller supply so did not pick it up from the pharmacy. States running out of insulin today. Got angry with me when I tried to clarify what was going on as the pharmacy had told me they filled a one month supply and weren't withholding it from him and the system does not show a renewal since June. He stated he was angry that we "made the mistake twice". I told him I was refilling the 90 days but did remind him to check his script when he got it next time as he stated it was a paper script rather then waiting until the weekend if he would only accept a 90 day supply.  He replied he shouldn't have to check the script and it was our responsibility to make sure it was 90 days.

## 2013-06-30 NOTE — Telephone Encounter (Signed)
Pt needs to f/u , please inform

## 2013-06-30 NOTE — Telephone Encounter (Signed)
Please call pt and advise him that he is due for his 90 days f/u, pt is diabetic and was only seen once

## 2013-07-02 NOTE — Telephone Encounter (Signed)
scheduled 07/05/2013

## 2013-07-05 ENCOUNTER — Encounter (INDEPENDENT_AMBULATORY_CARE_PROVIDER_SITE_OTHER): Payer: No Typology Code available for payment source | Admitting: Nurse Practitioner

## 2013-07-05 ENCOUNTER — Encounter (INDEPENDENT_AMBULATORY_CARE_PROVIDER_SITE_OTHER): Payer: Self-pay | Admitting: Student in an Organized Health Care Education/Training Program

## 2013-07-05 ENCOUNTER — Ambulatory Visit (INDEPENDENT_AMBULATORY_CARE_PROVIDER_SITE_OTHER): Payer: No Typology Code available for payment source

## 2013-07-05 VITALS — BP 121/79 | HR 90 | Temp 98.2°F | Ht 68.0 in | Wt 328.0 lb

## 2013-07-05 MED ORDER — LISINOPRIL 2.5 MG PO TABS
2.5000 mg | ORAL_TABLET | Freq: Every day | ORAL | Status: DC
Start: 2013-07-05 — End: 2013-09-19

## 2013-07-05 MED ORDER — PANTOPRAZOLE SODIUM 40 MG PO TBEC
40.0000 mg | DELAYED_RELEASE_TABLET | Freq: Every day | ORAL | Status: DC
Start: 2013-07-05 — End: 2013-09-27

## 2013-07-05 MED ORDER — VENLAFAXINE HCL 75 MG PO TABS
75.0000 mg | ORAL_TABLET | Freq: Two times a day (BID) | ORAL | Status: DC
Start: 2013-07-05 — End: 2013-07-05

## 2013-07-05 MED ORDER — INSULIN ASPART 100 UNIT/ML SC SOLN
60.0000 [IU] | Freq: Three times a day (TID) | SUBCUTANEOUS | Status: DC
Start: 2013-07-05 — End: 2013-09-19

## 2013-07-05 MED ORDER — INSULIN GLARGINE 100 UNIT/ML SC SOLN
SUBCUTANEOUS | Status: DC
Start: 2013-07-05 — End: 2013-09-19

## 2013-07-05 MED ORDER — ISOSORBIDE MONONITRATE ER 60 MG PO TB24
ORAL_TABLET | ORAL | Status: DC
Start: 2013-07-05 — End: 2013-09-19

## 2013-07-05 MED ORDER — VENLAFAXINE HCL 75 MG PO TABS
75.0000 mg | ORAL_TABLET | Freq: Every day | ORAL | Status: DC
Start: 2013-07-05 — End: 2013-09-27

## 2013-07-05 MED ORDER — ESOMEPRAZOLE MAGNESIUM 40 MG PO CPDR
40.0000 mg | DELAYED_RELEASE_CAPSULE | Freq: Every day | ORAL | Status: DC
Start: 2013-07-05 — End: 2013-09-19

## 2013-07-05 MED ORDER — METFORMIN HCL 850 MG PO TABS
850.0000 mg | ORAL_TABLET | Freq: Three times a day (TID) | ORAL | Status: DC
Start: 2013-07-05 — End: 2013-09-19

## 2013-07-05 NOTE — Patient Instructions (Signed)
Chest Pain, Uncertain Cause  Chest pain can happen for a number of reasons. Sometimes the cause can not be determined. If yourcondition does not seem serious, and your pain does not appear to be coming from your heart, your doctor may recommend watching it closely. Sometimes the signs of a serious problem take more time to appear. Therefore, watch for the warning signs listed below.  Home care  After your visit, follow these recommendations:   Rest today and avoid strenuous activity.   Take any prescribed medicine as directed.  Follow-up care  Follow up with your doctor or this facility as instructed or if you do not start to feel better within 24 hours.  Call 911  Get immediate medical attention if any of the following occur:   A change in the type of pain: if it feels different, becomes more severe, lasts longer, or begins to spread into your shoulder, arm, neck, jaw or back   Shortness of breath or increased pain with breathing   Weakness, dizziness, or fainting   Rapid heart beat  Get prompt medical ttention  Call your doctor right away if any of the following occur:   Cough with dark colored sputum (phlegm) or blood   Fever of 100.4F(38C) or higher, or as directed by your health care provider   Swelling, pain or redness in one leg   2000-2014 [employer], [address], [place] [address]. All rights reserved. This information is not intended as a substitute for professional medical care. Always follow your healthcare professional's instructions.

## 2013-07-06 LAB — COMPREHENSIVE METABOLIC PANEL
ALT: 50 U/L — ABNORMAL HIGH (ref 9–46)
AST (SGOT): 21 U/L (ref 10–40)
Albumin/Globulin Ratio: 1.9 (ref 1.0–2.5)
Albumin: 4.5 G/DL (ref 3.6–5.1)
Alkaline Phosphatase: 72 U/L (ref 40–115)
BUN: 14 MG/DL (ref 7–25)
Bilirubin, Total: 0.4 MG/DL (ref 0.2–1.2)
CO2: 21 mmol/L (ref 19–30)
Calcium: 9.6 MG/DL (ref 8.6–10.3)
Chloride: 104 mmol/L (ref 98–110)
Creatinine: 0.74 mg/dL (ref 0.60–1.35)
EGFR African American: 126 mL/min/{1.73_m2} (ref >=60)
EGFR: 109 mL/min/{1.73_m2} (ref >=60)
Globulin: 2.4 G/DL (ref 1.9–3.7)
Glucose: 98 MG/DL (ref 65–99)
Potassium: 4.1 mmol/L (ref 3.5–5.3)
Protein, Total: 6.9 G/DL (ref 6.1–8.1)
Sodium: 141 mmol/L (ref 135–146)

## 2013-07-06 LAB — TSH: TSH: 1.77 mIU/L (ref 0.40–4.50)

## 2013-07-06 LAB — MICROALBUMIN, RANDOM URINE
Creatinine, UR: 170 mg/dL (ref 20–370)
Microalbumin MCG/MG Creatinine: 31.2 mcg/mg crea — AB
Microalbumin: 5.3 mg/dL

## 2013-07-06 LAB — LIPID PANEL
Cholesterol / HDL Ratio: 3.1 (ref 0.0–5.0)
Cholesterol: 151 MG/DL (ref 125–200)
HDL: 48 mg/dL (ref >=40)
LDL Calculated: 88 mg/dL (ref <130)
Non HDL Cholesterol (LDL and VLDL): 103 mg/dL
Triglycerides: 75 MG/DL (ref <150)

## 2013-07-06 LAB — HEMOGLOBIN A1C: Hemoglobin A1C: 7.3 % — ABNORMAL HIGH (ref <5.7)

## 2013-07-07 NOTE — Progress Notes (Signed)
Subjective:   Brent Palmer is a 47 y.o. male with the following:   Hypertension,    insulin-requiring diabetes, taking lantus 110 bid, novolog 60 TID, metformin 850 mg tid, no hypoglycemic Sx, he did not bring his BS log, he continue to gain weight and feels he is getting resistant to Insulin   asthma, stable   anxiety, stable on effexor 75 mg qday    He has h/o Prinzmetal's angina.  in 2008 he first developed chest discomfort symptoms. Eventually this led to a cardiac catheterization which revealed coronary spasm only. No significant coronary disease was noted.  Chest Pain: Patient complains of chest pain. Onset was 7 days ago, with unchanged course since that time. The patient describes the pain as intermittent, sharp and substernal in nature, does not radiate. Patient rates pain as a 3/10 in intensity.  Associated symptoms are fatigue. Aggravating factors are large meals.  Alleviating factors are: antacids. Patient's cardiac risk factors are diabetes mellitus, dyslipidemia, hypertension, male gender, obesity (BMI >= 30 kg/m2) and sedentary lifestyle.  Patient's risk factors for DVT/PE: none. Previous cardiac testing: coronary angiography, date 2008, location [place]. Sx are not exertional, not pleuritic, not positional    HAS h/o Barrett's esophagitis and GERD. He frequently has mid chest discomfort related to meals which improves with belching. He is recently been started on Protonix to help, nexium used to help better, he had to stop due to insurance .    He does exercise doing walking about 2 days a week. He tells me that he only gets his heart rate up to about 70% of the maximum predicted this is afraid of straining his heart. With that he has no symptoms.   He continues to snore and frequently naps due to his fatigue , in 2005, he had a sleep study that did not show significant sleep apnea. Since that time however he's gained a significant amount of weight, BMI: 50. He tells me that his wife does  report that he often stops breathing at night. He does have excessive daytime sleepiness and takes naps. 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      Current Outpatient Prescriptions   Medication Sig Dispense Refill   . albuterol (PROAIR HFA) 108 (90 BASE) MCG/ACT inhaler Inhale 2 puffs into the lungs every 6 (six) hours as needed for Wheezing or Shortness of Breath.  1 Inhaler  0   . Albuterol Sulfate (PROAIR HFA IN) Inhale into the lungs.       Marland Kitchen amLODIPine (NORVASC) 5 MG tablet Take 1 tablet (5 mg total) by mouth daily.  90 tablet  0   . B-D INS SYR ULTRAFINE 1CC/31G 31G X 5/16" 1 ML Misc        . dextrose ([employer] GLUCOSE) 40 % Gel Take 15 g by mouth as needed.       Marland Kitchen esomeprazole (NEXIUM) 40 MG capsule Take 1 capsule (40 mg total) by mouth daily.  90 capsule  1   . furosemide (LASIX) 20 MG tablet Take 20 mg by mouth 2 (two) times daily.       . insulin aspart (NOVOLOG) 100 UNIT/ML injection Inject 60 Units into the skin 3 (three) times daily before meals.  17 pen  0   . insulin glargine (LANTUS) 100 UNIT/ML injection 110 units bid  20 pen  0   . isosorbide mononitrate (IMDUR) 60 MG 24 hr tablet 1  tab 1/2 po every day  135 tablet  0   . lisinopril (PRINIVIL,ZESTRIL) 2.5 MG tablet Take 1 tablet (2.5 mg total) by mouth daily.  90 tablet  0   . metFORMIN (GLUCOPHAGE) 850 MG tablet Take 1 tablet (850 mg total) by mouth 3 (three) times daily.  270 tablet  0   . nitroglycerin (NITROSTAT) 0.4 MG SL tablet Place 1 tablet (0.4 mg total) under the tongue every 5 (five) minutes as needed.  90 tablet  0   . omeprazole (PRILOSEC) 20 MG capsule Take 20 mg by mouth daily.       . pantoprazole (PROTONIX) 40 MG tablet Take 1 tablet (40 mg total) by mouth daily.  90 tablet  0   . potassium chloride SA (K-DUR,KLOR-CON) 20 MEQ tablet Take 20 mEq by mouth 2 (two) times daily.       Marland Kitchen venlafaxine (EFFEXOR) 75 MG tablet Take 1 tablet (75 mg total) by mouth daily.  90 tablet  0       Hypertension ROS: taking medications as instructed, no medication side effects noted, no TIA's, +chest pain on exertion, no dyspnea on exertion and no swelling of ankles.   . Constitutional: + fatigue,NO  fever, chills   Eyes: negative for vision problems   Ears, nose, mouth, throat, and face: negative for nose bleeds, nasal congestion   Respiratory: negative for SOB, cough , wheezing   Cardiovascular: negative for CP, dizziness, palpitations, swelling , orthopnea   Gastrointestinal: negative for diarrhea , nausea, vomiting, constipation, + HEART BURN    Genitourinary:negative for dysuria, frequency and hematuria   Integument/breast: negative for nodule   Hematologic/lymphatic: negative for enlarged LAD,   Musculoskeletal:negative for myalgia, joint pain and swelling   Neurological: negative for HA, dizziness, tingling ,   Behavioral/Psych: negative for anxiety, depression   Endocrine: + weight gain, NO weight loss, striae, skin pigmentation         Objective:   BP 121/79  Pulse 90  Temp 98.2 F (36.8 C) (Oral)  Ht 1.727 m (5\' 8" )  Wt 148.78 kg (328 lb)  BMI 49.88 kg/m2  SpO2 97%   CONSTITUTIONAL Patient is afebrile, Vital signs reviewed. Normo tensive. Very obese, BMI: 50  HEAD Atraumatic, Normocephallc.   EYES Eyes are normal to inspection, PERRL, No discharge from eyes,   ENT Ears normal to inspection, Nose examination normal, Posterior pharynx normal.   NECK Normal ROM, No jugular venous distention, No meningeal signs.   RESPIRATORY CHEST Chest is nontender, Breath sounds normal.   CARDIOVASCULAR RRR, Heart sounds normal, Normal S1 S2.   ABDOMEN Abdomen is tender over epigastric area , No pulsatile masses, No other masses,   Bowel sounds normal, No distension, No peritoneal signs.   BACK There is no CVA Tenderness, There is no tenderness to palpation.   UPPER EXTREMITY Inspection normal, No cyanosis.   LOWER EXTREMITY Inspection normal, No cyanosis.   NEURO Cranial nerves intact, No cerebellar deficits,  Normal DTRs, Babinski absent, Speech normal, Memory normal   SKIN Skin is warm, Skin is dry, Skin is normal color   LYMPHATIC No adenopathy in neck.   PSYCHIATRIC Oriented X 3, Normal affect. Normal insight.    Assessment:    Hypertension well controlled.     Plan:   Current treatment plan is effective, no change in therapy.  Reviewed diet, exercise and weight control.  Recommended sodium restriction.  Copy of written low fat low cholesterol diet provided and reviewed.  Cardiovascular risk  and specific lipid/LDL goals reviewed.  Reviewed medications and side effects in detail.  1. DM (diabetes mellitus)  insulin aspart (NOVOLOG) 100 UNIT/ML injection    insulin glargine (LANTUS) 100 UNIT/ML injection    metFORMIN (GLUCOPHAGE) 850 MG tablet    Microalbumin, Random Urine   2. Depression  Ambulatory referral to Endocrinology    Hemoglobin A1c    venlafaxine (EFFEXOR) 75 MG tablet    DISCONTINUED: venlafaxine (EFFEXOR) 75 MG tablet   3. Barrett esophagus  nexium , refer to GI for EGD, lifestyle modif d/w Pt    4. HTN (hypertension)  lisinopril (PRINIVIL,ZESTRIL) 2.5 MG tablet    isosorbide mononitrate (IMDUR) 60 MG 24 hr tablet   5. CAD (coronary artery disease)  isosorbide mononitrate (IMDUR) 60 MG 24 hr tablet   6. HLD (hyperlipidemia)  Comprehensive metabolic panel    Lipid panel    TSH   7. Chest pain ; likely GERD X-ray chest PA and lateral, EKG is nl , start nexium      EKG : NSR, nl axis , no acute ST changes .  F/u with Dr Deetta Perla,  Pt has a job interview and refused to f/u with [facility] today,   Pt was advised to bring old records   Labs as above   Body mass index is 49.88 kg/(m^2).  Discussed the patient's BMI with him.  The BMI is above average; BMI management plan is completed.  Pt to see [facility]  Refer to endocrin : [facility] for his DM   RTC prn, in 3 months o/w   >1 hour spent with Pt, >30 spent in coordinating care and counseling

## 2013-07-08 ENCOUNTER — Other Ambulatory Visit (INDEPENDENT_AMBULATORY_CARE_PROVIDER_SITE_OTHER): Payer: Self-pay | Admitting: Student in an Organized Health Care Education/Training Program

## 2013-07-08 MED ORDER — SIMVASTATIN 10 MG PO TABS
10.0000 mg | ORAL_TABLET | Freq: Every evening | ORAL | Status: DC
Start: 2013-07-08 — End: 2013-09-19

## 2013-07-16 ENCOUNTER — Other Ambulatory Visit (INDEPENDENT_AMBULATORY_CARE_PROVIDER_SITE_OTHER): Payer: Self-pay | Admitting: Student in an Organized Health Care Education/Training Program

## 2013-07-16 ENCOUNTER — Encounter (INDEPENDENT_AMBULATORY_CARE_PROVIDER_SITE_OTHER): Payer: No Typology Code available for payment source | Admitting: Nurse Practitioner

## 2013-07-22 ENCOUNTER — Encounter (INDEPENDENT_AMBULATORY_CARE_PROVIDER_SITE_OTHER): Payer: Self-pay | Admitting: Student in an Organized Health Care Education/Training Program

## 2013-08-20 ENCOUNTER — Ambulatory Visit (INDEPENDENT_AMBULATORY_CARE_PROVIDER_SITE_OTHER): Payer: No Typology Code available for payment source

## 2013-08-20 NOTE — Progress Notes (Signed)
Patient came in for Flu Shot only

## 2013-09-19 ENCOUNTER — Encounter (INDEPENDENT_AMBULATORY_CARE_PROVIDER_SITE_OTHER): Payer: Self-pay | Admitting: Student in an Organized Health Care Education/Training Program

## 2013-09-19 ENCOUNTER — Ambulatory Visit (INDEPENDENT_AMBULATORY_CARE_PROVIDER_SITE_OTHER)
Payer: No Typology Code available for payment source | Admitting: Student in an Organized Health Care Education/Training Program

## 2013-09-19 VITALS — BP 123/76 | HR 118 | Temp 97.8°F | Resp 12 | Ht 68.0 in | Wt 331.0 lb

## 2013-09-19 MED ORDER — AMLODIPINE BESYLATE 5 MG PO TABS
5.0000 mg | ORAL_TABLET | Freq: Every day | ORAL | Status: DC
Start: 2013-09-19 — End: 2013-12-27

## 2013-09-19 MED ORDER — ESCITALOPRAM OXALATE 10 MG PO TABS
10.0000 mg | ORAL_TABLET | Freq: Every day | ORAL | Status: DC
Start: 2013-09-19 — End: 2013-12-27

## 2013-09-19 MED ORDER — LISINOPRIL 2.5 MG PO TABS
2.5000 mg | ORAL_TABLET | Freq: Every day | ORAL | Status: DC
Start: 2013-09-19 — End: 2013-12-27

## 2013-09-19 MED ORDER — INSULIN GLARGINE 100 UNIT/ML SC SOLN
SUBCUTANEOUS | Status: DC
Start: 2013-09-19 — End: 2013-09-19

## 2013-09-19 MED ORDER — INSULIN ASPART 100 UNIT/ML SC SOLN
SUBCUTANEOUS | Status: DC
Start: 2013-09-19 — End: 2013-09-19

## 2013-09-19 MED ORDER — INSULIN GLARGINE 100 UNIT/ML SC SOLN
SUBCUTANEOUS | Status: DC
Start: 2013-09-19 — End: 2013-12-27

## 2013-09-19 MED ORDER — SITAGLIPTIN PHOSPHATE 100 MG PO TABS
100.0000 mg | ORAL_TABLET | Freq: Every day | ORAL | Status: DC
Start: 2013-09-19 — End: 2013-12-27

## 2013-09-19 MED ORDER — SIMVASTATIN 10 MG PO TABS
10.0000 mg | ORAL_TABLET | Freq: Every evening | ORAL | Status: DC
Start: 2013-09-19 — End: 2013-12-27

## 2013-09-19 MED ORDER — METFORMIN HCL 850 MG PO TABS
850.0000 mg | ORAL_TABLET | Freq: Three times a day (TID) | ORAL | Status: DC
Start: 2013-09-19 — End: 2013-12-27

## 2013-09-19 MED ORDER — INSULIN ASPART 100 UNIT/ML SC SOLN
60.0000 [IU] | Freq: Three times a day (TID) | SUBCUTANEOUS | Status: DC
Start: 2013-09-19 — End: 2013-09-27

## 2013-09-19 MED ORDER — INSULIN ASPART 100 UNIT/ML SC SOLN
SUBCUTANEOUS | Status: DC
Start: 2013-09-19 — End: 2013-12-27

## 2013-09-19 MED ORDER — ESOMEPRAZOLE MAGNESIUM 40 MG PO CPDR
40.0000 mg | DELAYED_RELEASE_CAPSULE | Freq: Every day | ORAL | Status: DC
Start: 2013-09-19 — End: 2013-12-27

## 2013-09-19 MED ORDER — ESCITALOPRAM OXALATE 10 MG PO TABS
10.0000 mg | ORAL_TABLET | Freq: Every day | ORAL | Status: DC
Start: 2013-09-19 — End: 2013-09-19

## 2013-09-19 MED ORDER — ISOSORBIDE MONONITRATE ER 60 MG PO TB24
ORAL_TABLET | ORAL | Status: DC
Start: 2013-09-19 — End: 2013-12-30

## 2013-09-19 MED ORDER — INSULIN GLARGINE 100 UNIT/ML SC SOLN
SUBCUTANEOUS | Status: DC
Start: 2013-09-19 — End: 2013-09-27

## 2013-09-19 NOTE — Patient Instructions (Signed)
Weight Management: Exercise and Activity  Studies show that people who exercise are the most likely to lose weight and keep it off. Exercise burns calories. It helps build muscle to make your body stronger. Make exercise part of your weight-management plan.    Make Activity Part of Your Day  You may not think you have the time to exercise. But you can work activity into your daily life. Take 10 minutes out of your lunch hour to take a walk. Walk to the newsstand to get your paper instead of having it delivered. Make it a habit to take the stairs instead of the elevator. Park in the farthest parking spot instead of the closest. You'll be surprised at how fast these little changes can make a difference.  The Benefits of Exercise   Exercise increases your metabolism (the speed at which your body burns calories).   Regular exercise can increase the amount of muscle in your body. Muscle burns calories faster than fat. The more muscle you have, the more calories you burn.   Exercise gives you energy and curbs your appetite.   Exercise decreases stress and helps you sleep better.  Make Exercise Fun  Exercise can be fun. Choose an activity you enjoy. You may even get a friend to do it with you.   Take a resistance-training or aerobics class   Join a team sport   Take a dance class   Walk the dog   Ride a bike  If you have health problems, be sure to ask your doctor before you start an exercise program. Have a fitness professional help you develop a plan that's safe for you.    2000-2014 [employer], [address], [place] [address]. All rights reserved. This information is not intended as a substitute for professional medical care. Always follow your healthcare professional's instructions.

## 2013-09-21 NOTE — Progress Notes (Signed)
Subjective:   Brent Palmer is a 47 y.o. male with the following:   Hypertension,    insulin-requiring diabetes, taking lantus 110 bid, novolog 60 TID, metformin 850 mg tid, no hypoglycemic Sx, he did not bring his BS log, he continue to gain weight and feels he is getting resistant to Insulin, is planning on seeing Dr Efraim Kaufmann Antonik    asthma, stable   anxiety, was switched to  effexor 75 mg qday after he felt "dull" on lexapro, he started having homocidal thoughts on LEXAPRO, completely better after she stopped , no HI/SI currently, lexapro worked really well except for the "dullness"   He has h/o Prinzmetal's angina.  in 2008 he first developed chest discomfort symptoms. Eventually this led to a cardiac catheterization which revealed coronary spasm only. No significant coronary disease was noted.   Chest Pain:  Better after he started taking nexium    HAS h/o Barrett's esophagitis and GERD. He frequently has mid chest discomfort related to meals which improves with belching. He is recently been started on Protonix to help, nexium used to help better, he had to stop due to insurance .    He does exercise doing walking about 2 days a week. He tells me that he only gets his heart rate up to about 70% of the maximum predicted this is afraid of straining his heart. With that he has no symptoms.  He continues to snore and frequently naps due to his fatigue , in 2005, he had a sleep study that did not show significant sleep apnea. Since that time however he's gained a significant amount of weight, BMI: 50. He tells me that his wife does report that he often stops breathing at night. He does have excessive daytime sleepiness and takes naps.     Current Outpatient Prescriptions   Medication Sig Dispense Refill   . albuterol (PROAIR HFA) 108 (90 BASE) MCG/ACT inhaler Inhale 2 puffs into the lungs every 6 (six) hours as needed for Wheezing or Shortness of Breath.  1 Inhaler  0   . Albuterol Sulfate (PROAIR HFA  IN) Inhale into the lungs.       Marland Kitchen amLODIPine (NORVASC) 5 MG tablet Take 1 tablet (5 mg total) by mouth daily.  90 tablet  0   . B-D INS SYR ULTRAFINE 1CC/31G 31G X 5/16" 1 ML Misc        . dextrose ([employer] GLUCOSE) 40 % Gel Take 15 g by mouth as needed.       Marland Kitchen esomeprazole (NEXIUM) 40 MG capsule Take 1 capsule (40 mg total) by mouth daily.  90 capsule  1   . furosemide (LASIX) 20 MG tablet Take 20 mg by mouth 2 (two) times daily.       . insulin aspart (NOVOLOG) 100 UNIT/ML injection Inject 60 Units into the skin 3 (three) times daily before meals.  17 pen  0   . insulin glargine (LANTUS) 100 UNIT/ML injection 110 units bid  20 pen  0   . isosorbide mononitrate (IMDUR) 60 MG 24 hr tablet 1 tab 1/2 po every day  135 tablet  0   . lisinopril (PRINIVIL,ZESTRIL) 2.5 MG tablet Take 1 tablet (2.5 mg total) by mouth daily.  90 tablet  0   . metFORMIN (GLUCOPHAGE) 850 MG tablet Take 1 tablet (850 mg total) by mouth 3 (three) times daily.  270 tablet  0   . nitroglycerin (NITROSTAT) 0.4 MG SL tablet Place 1 tablet (0.4 mg total)  under the tongue every 5 (five) minutes as needed.  90 tablet  0   . omeprazole (PRILOSEC) 20 MG capsule Take 20 mg by mouth daily.       . pantoprazole (PROTONIX) 40 MG tablet Take 1 tablet (40 mg total) by mouth daily.  90 tablet  0   . potassium chloride SA (K-DUR,KLOR-CON) 20 MEQ tablet Take 20 mEq by mouth 2 (two) times daily.       Marland Kitchen venlafaxine (EFFEXOR) 75 MG tablet Take 1 tablet (75 mg total) by mouth daily.  90 tablet  0      Hypertension ROS: taking medications as instructed, no medication side effects noted, no TIA's, +chest pain on exertion, no dyspnea on exertion and no swelling of ankles.   . Constitutional: + fatigue,NO  fever, chills   Eyes: negative for vision problems   Ears, nose, mouth, throat, and face: negative for nose bleeds, nasal congestion   Respiratory: negative for SOB, cough , wheezing   Cardiovascular: negative for CP, dizziness, palpitations, swelling , orthopnea    Gastrointestinal: negative for diarrhea , nausea, vomiting, constipation, + HEART BURN    Genitourinary:negative for dysuria, frequency and hematuria   Integument/breast: negative for nodule   Hematologic/lymphatic: negative for enlarged LAD,   Musculoskeletal:negative for myalgia, joint pain and swelling   Neurological: negative for HA, dizziness, tingling ,   Behavioral/Psych: negative for anxiety, depression   Endocrine: + weight gain, NO weight loss, striae, skin pigmentation         Objective:   BP 121/79  Pulse 90  Temp 98.2 F (36.8 C) (Oral)  Ht 1.727 m (5\' 8" )  Wt 148.78 kg (328 lb)  BMI 49.88 kg/m2  SpO2 97%   CONSTITUTIONAL Patient is afebrile, Vital signs reviewed. Normo tensive. Very obese, BMI: 50  HEAD Atraumatic, Normocephallc.   EYES Eyes are normal to inspection, PERRL, No discharge from eyes,   ENT Ears normal to inspection, Nose examination normal, Posterior pharynx normal.   NECK Normal ROM, No jugular venous distention, No meningeal signs.   RESPIRATORY CHEST Chest is nontender, Breath sounds normal.   CARDIOVASCULAR RRR, Heart sounds normal, Normal S1 S2.   ABDOMEN Abdomen is tender over epigastric area , No pulsatile masses, No other masses,   Bowel sounds normal, No distension, No peritoneal signs.   BACK There is no CVA Tenderness, There is no tenderness to palpation.   UPPER EXTREMITY Inspection normal, No cyanosis.   LOWER EXTREMITY Inspection normal, No cyanosis.   NEURO Cranial nerves intact, No cerebellar deficits, Normal DTRs, Babinski absent, Speech normal, Memory normal   SKIN Skin is warm, Skin is dry, Skin is normal color   LYMPHATIC No adenopathy in neck.   PSYCHIATRIC Oriented X 3, Normal affect. Normal insight.    Assessment:    Hypertension well controlled. DM, prinzmetal angina, metabolic syndrome,   Plan:   Current treatment plan is effective, no change in therapy.  Reviewed diet, exercise and weight control.  Recommended sodium restriction.  Copy of written low fat  low cholesterol diet provided and reviewed.  Cardiovascular risk and specific lipid/LDL goals reviewed.  Reviewed medications and side effects in detail  1. Diabetes mellitus, type II  Ambulatory referral to Endocrinology    sitaGLIPtin (JANUVIA) 100 MG tablet   2. Depression  escitalopram (LEXAPRO) 10 MG tablet    DISCONTINUED: escitalopram (LEXAPRO) 10 MG tablet   3. DM (diabetes mellitus)  metFORMIN (GLUCOPHAGE) 850 MG tablet    insulin glargine (LANTUS)  100 UNIT/ML injection    insulin aspart (NOVOLOG) 100 UNIT/ML injection   4. HTN (hypertension)  amLODIPine (NORVASC) 5 MG tablet    lisinopril (PRINIVIL,ZESTRIL) 2.5 MG tablet    isosorbide mononitrate (IMDUR) 60 MG 24 hr tablet   5. HLD (hyperlipidemia)  simvastatin (ZOCOR) 10 MG tablet   6. CAD (coronary artery disease)  isosorbide mononitrate (IMDUR) 60 MG 24 hr tablet   7. Barrett esophagus  esomeprazole (NEXIUM) 40 MG capsule   8. Prinzmetal angina  Ambulatory referral to Cardiology      con't current meds  .  F/u with Dr Deetta Perla,  Body mass index is 49.88 kg/(m^2).  Discussed the patient's BMI with him.  The BMI is above average; BMI management plan is completed.  Pt to see [facility] weight loss program  May be a good candidate for my ideal protein diet plan   RTC prn, in 3 months o/w   >1 hour spent with Pt, >30 spent in coordinating care and counseling

## 2013-09-27 ENCOUNTER — Other Ambulatory Visit (INDEPENDENT_AMBULATORY_CARE_PROVIDER_SITE_OTHER): Payer: Self-pay

## 2013-12-27 ENCOUNTER — Encounter (INDEPENDENT_AMBULATORY_CARE_PROVIDER_SITE_OTHER): Payer: Self-pay | Admitting: Student in an Organized Health Care Education/Training Program

## 2013-12-27 ENCOUNTER — Other Ambulatory Visit (INDEPENDENT_AMBULATORY_CARE_PROVIDER_SITE_OTHER): Payer: Self-pay | Admitting: Student in an Organized Health Care Education/Training Program

## 2013-12-27 ENCOUNTER — Ambulatory Visit (INDEPENDENT_AMBULATORY_CARE_PROVIDER_SITE_OTHER)
Payer: No Typology Code available for payment source | Admitting: Student in an Organized Health Care Education/Training Program

## 2013-12-27 VITALS — BP 126/79 | HR 96 | Temp 97.9°F | Ht 68.0 in | Wt 335.0 lb

## 2013-12-27 DIAGNOSIS — E119 Type 2 diabetes mellitus without complications: Secondary | ICD-10-CM

## 2013-12-27 DIAGNOSIS — I201 Angina pectoris with documented spasm: Secondary | ICD-10-CM

## 2013-12-27 DIAGNOSIS — I2510 Atherosclerotic heart disease of native coronary artery without angina pectoris: Secondary | ICD-10-CM

## 2013-12-27 DIAGNOSIS — F32A Depression, unspecified: Secondary | ICD-10-CM

## 2013-12-27 DIAGNOSIS — I10 Essential (primary) hypertension: Secondary | ICD-10-CM

## 2013-12-27 DIAGNOSIS — F3289 Other specified depressive episodes: Secondary | ICD-10-CM

## 2013-12-27 DIAGNOSIS — K2270 Barrett's esophagus without dysplasia: Secondary | ICD-10-CM

## 2013-12-27 DIAGNOSIS — J329 Chronic sinusitis, unspecified: Secondary | ICD-10-CM

## 2013-12-27 LAB — POCT GLYCOSYLATED HEMOGLOBIN (HGB A1C): POCT Hgb A1C: 6.3 — AB (ref 3.9–5.9)

## 2013-12-27 MED ORDER — METFORMIN HCL 850 MG PO TABS
850.0000 mg | ORAL_TABLET | Freq: Three times a day (TID) | ORAL | Status: DC
Start: 2013-12-27 — End: 2014-03-31

## 2013-12-27 MED ORDER — AMLODIPINE BESYLATE 5 MG PO TABS
5.0000 mg | ORAL_TABLET | Freq: Every day | ORAL | Status: DC
Start: 2013-12-27 — End: 2014-03-31

## 2013-12-27 MED ORDER — AMOXICILLIN-POT CLAVULANATE 875-125 MG PO TABS
1.0000 | ORAL_TABLET | Freq: Two times a day (BID) | ORAL | Status: DC
Start: 2013-12-27 — End: 2013-12-27

## 2013-12-27 MED ORDER — LISINOPRIL 2.5 MG PO TABS
2.5000 mg | ORAL_TABLET | Freq: Every day | ORAL | Status: DC
Start: 2013-12-27 — End: 2014-03-31

## 2013-12-27 MED ORDER — ESOMEPRAZOLE MAGNESIUM 40 MG PO CPDR
40.0000 mg | DELAYED_RELEASE_CAPSULE | Freq: Every day | ORAL | Status: DC
Start: 2013-12-27 — End: 2014-03-31

## 2013-12-27 MED ORDER — FLUTICASONE PROPIONATE 50 MCG/ACT NA SUSP
2.0000 | Freq: Every day | NASAL | Status: AC
Start: 2013-12-27 — End: 2014-12-27

## 2013-12-27 MED ORDER — DIAZEPAM 2 MG PO TABS
2.0000 mg | ORAL_TABLET | Freq: Every day | ORAL | Status: AC | PRN
Start: 2013-12-27 — End: 2014-12-27

## 2013-12-27 MED ORDER — AMOXICILLIN-POT CLAVULANATE 875-125 MG PO TABS
1.0000 | ORAL_TABLET | Freq: Two times a day (BID) | ORAL | Status: AC
Start: 2013-12-27 — End: 2014-01-06

## 2013-12-27 MED ORDER — SITAGLIPTIN PHOSPHATE 100 MG PO TABS
100.0000 mg | ORAL_TABLET | Freq: Every day | ORAL | Status: DC
Start: 2013-12-27 — End: 2014-03-31

## 2013-12-27 MED ORDER — ESCITALOPRAM OXALATE 10 MG PO TABS
10.0000 mg | ORAL_TABLET | Freq: Every day | ORAL | Status: DC
Start: 2013-12-27 — End: 2014-03-31

## 2013-12-27 MED ORDER — INSULIN ASPART 100 UNIT/ML SC SOLN
SUBCUTANEOUS | Status: DC
Start: 2013-12-27 — End: 2014-03-31

## 2013-12-27 MED ORDER — FLUTICASONE PROPIONATE 50 MCG/ACT NA SUSP
2.0000 | Freq: Every day | NASAL | Status: DC
Start: 2013-12-27 — End: 2013-12-27

## 2013-12-27 MED ORDER — INSULIN GLARGINE 100 UNIT/ML SC SOLN
SUBCUTANEOUS | Status: DC
Start: 2013-12-27 — End: 2014-03-31

## 2013-12-27 MED ORDER — NITROGLYCERIN 0.4 MG SL SUBL
0.4000 mg | SUBLINGUAL_TABLET | SUBLINGUAL | Status: DC | PRN
Start: 2013-12-27 — End: 2014-06-26

## 2013-12-27 NOTE — Patient Instructions (Signed)
Weight Management: Exercise and Activity  Studies show that people who exercise are the most likely to lose weight and keep it off. Exercise burns calories. It helps build muscle to make your body stronger. Make exercise part of your weight-management plan.    Make Activity Part of Your Day  You may not think you have the time to exercise. But you can work activity into your daily life. Take 10 minutes out of your lunch hour to take a walk. Walk to the newsstand to get your paper instead of having it delivered. Make it a habit to take the stairs instead of the elevator. Park in the farthest parking spot instead of the closest. You'll be surprised at how fast these little changes can make a difference.  The Benefits of Exercise   Exercise increases your metabolism (the speed at which your body burns calories).   Regular exercise can increase the amount of muscle in your body. Muscle burns calories faster than fat. The more muscle you have, the more calories you burn.   Exercise gives you energy and curbs your appetite.   Exercise decreases stress and helps you sleep better.  Make Exercise Fun  Exercise can be fun. Choose an activity you enjoy. You may even get a friend to do it with you.   Take a resistance-training or aerobics class   Join a team sport   Take a dance class   Walk the dog   Ride a bike  If you have health problems, be sure to ask your doctor before you start an exercise program. Have a fitness professional help you develop a plan that's safe for you.    2000-2014 [employer], [address], [place] [address]. All rights reserved. This information is not intended as a substitute for professional medical care. Always follow your healthcare professional's instructions.

## 2013-12-27 NOTE — Progress Notes (Signed)
Subjective:   Brent Palmer is a 48 y.o. male with the following    Hypertension: very  compliant    - The patient feels this issue is  controlled.  - The patient has been monitoring blood pressure.  - The patient has  been adhering to the recommended no-added-salt diet.  - The patient has not been compliant to the recommended exercise program.  - The patient is  tolerating the current medication regimen well.  - The patient denies orthostasis.  - The patient denies dyspnea.  - The patient denies edema.      insulin-requiring diabetes, taking lantus 110 bid, novolog 60 TID, metformin 850 mg tid, januvia 100 mg qday, no hypoglycemic Sx, he did not bring his BS log, he continue to gain weight and feels he is getting resistant to Insulin, is still planning on seeing Dr Orma Flaming, but holding off due to copay   asthma, stable   Anxiety:  no HI/SI currently, lexapro 10 mg qday  Works really well except for the "dullness"   He has h/o Prinzmetal's angina.  in 2008 he first developed chest discomfort symptoms. Eventually this led to a cardiac catheterization which revealed coronary spasm only. No significant coronary disease was noted.   Chest Pain:  Slightly better Better after he started taking nexium  .    He does exercise doing walking about 2 days a week. He tells me that he only gets his heart rate up to about 70% of the maximum predicted this is afraid of straining his heart. With that he has no symptoms.   He continues to snore and frequently naps due to his fatigue , in 2005, he had a sleep study that did not show significant sleep apnea. Since that time however he's gained a significant amount of weight, BMI: 50. He tells me that his wife does report that he often stops breathing at night. He does have excessive daytime sleepiness and takes naps.    He c/o b/l sinus pain x 1 month, nasal congestion, slight wet cough, no S/OB/wheezing     Current Outpatient Prescriptions   Medication Sig Dispense  Refill   . albuterol (PROAIR HFA) 108 (90 BASE) MCG/ACT inhaler Inhale 2 puffs into the lungs every 6 (six) hours as needed for Wheezing or Shortness of Breath.  1 Inhaler  0   . Albuterol Sulfate (PROAIR HFA IN) Inhale into the lungs.       Marland Kitchen amLODIPine (NORVASC) 5 MG tablet Take 1 tablet (5 mg total) by mouth daily.  90 tablet  0   . B-D INS SYR ULTRAFINE 1CC/31G 31G X 5/16" 1 ML Misc        . dextrose ([employer] GLUCOSE) 40 % Gel Take 15 g by mouth as needed.       Marland Kitchen esomeprazole (NEXIUM) 40 MG capsule Take 1 capsule (40 mg total) by mouth daily.  90 capsule  1   . furosemide (LASIX) 20 MG tablet Take 20 mg by mouth 2 (two) times daily.       . insulin aspart (NOVOLOG) 100 UNIT/ML injection Inject 60 Units into the skin 3 (three) times daily before meals.  17 pen  0   . insulin glargine (LANTUS) 100 UNIT/ML injection 110 units bid  20 pen  0   . isosorbide mononitrate (IMDUR) 60 MG 24 hr tablet 1 tab 1/2 po every day  135 tablet  0   . lisinopril (PRINIVIL,ZESTRIL) 2.5 MG tablet Take 1 tablet (2.5  mg total) by mouth daily.  90 tablet  0   . metFORMIN (GLUCOPHAGE) 850 MG tablet Take 1 tablet (850 mg total) by mouth 3 (three) times daily.  270 tablet  0   . nitroglycerin (NITROSTAT) 0.4 MG SL tablet Place 1 tablet (0.4 mg total) under the tongue every 5 (five) minutes as needed.  90 tablet  0   . omeprazole (PRILOSEC) 20 MG capsule Take 20 mg by mouth daily.       . pantoprazole (PROTONIX) 40 MG tablet Take 1 tablet (40 mg total) by mouth daily.  90 tablet  0   . potassium chloride SA (K-DUR,KLOR-CON) 20 MEQ tablet Take 20 mEq by mouth 2 (two) times daily.       Marland Kitchen venlafaxine (EFFEXOR) 75 MG tablet Take 1 tablet (75 mg total) by mouth daily.  90 tablet  0      Hypertension ROS: taking medications as instructed, no medication side effects noted, no TIA's, +chest pain on exertion, no dyspnea on exertion and no swelling of ankles.   . Constitutional: + fatigue,NO  fever, chills   Eyes: negative for vision problems   Ears,  nose, mouth, throat, and face: negative for nose bleeds, nasal congestion   Respiratory: negative for SOB, cough , wheezing   Cardiovascular: negative for CP, dizziness, palpitations, swelling , orthopnea   Gastrointestinal: negative for diarrhea , nausea, vomiting, constipation, + HEART BURN    Genitourinary:negative for dysuria, frequency and hematuria   Integument/breast: negative for nodule   Hematologic/lymphatic: negative for enlarged LAD,   Musculoskeletal:negative for myalgia, joint pain and swelling   Neurological: negative for HA, dizziness, tingling ,   Behavioral/Psych: negative for anxiety, depression   Endocrine: + weight gain, NO weight loss, striae, skin pigmentation         Objective:   Filed Vitals:    12/27/13 0947   BP: 126/79   Pulse: 96   Temp: 97.9 F (36.6 C)   SpO2: 97%       CONSTITUTIONAL Patient is afebrile, Vital signs reviewed. Normo tensive. Very obese, BMI: 50  HEAD Atraumatic, Normocephallc.   EYES Eyes are normal to inspection, PERRL, No discharge from eyes,   ENT Ears normal to inspection, Nose examination normal, Posterior pharynx normal.   NECK Normal ROM, No jugular venous distention, No meningeal signs.   RESPIRATORY CHEST Chest is nontender, Breath sounds normal.   CARDIOVASCULAR RRR, Heart sounds normal, Normal S1 S2.   ABDOMEN Abdomen is tender over epigastric area , No pulsatile masses, No other masses,   Bowel sounds normal, No distension, No peritoneal signs.   BACK There is no CVA Tenderness, There is no tenderness to palpation.   UPPER EXTREMITY Inspection normal, No cyanosis.   LOWER EXTREMITY Inspection normal, No cyanosis.   NEURO Cranial nerves intact, No cerebellar deficits, Normal DTRs, Babinski absent, Speech normal, Memory normal   SKIN Skin is warm, Skin is dry, Skin is normal color   LYMPHATIC No adenopathy in neck.   PSYCHIATRIC Oriented X 3, Normal affect. Normal insight.    Assessment:      1. HTN (hypertension)  lisinopril (PRINIVIL,ZESTRIL) 2.5 MG tablet     amLODIPine (NORVASC) 5 MG tablet   2. DM (diabetes mellitus)  POCT Glycosylated Hemoglobin (A1C)    Lipid panel    metFORMIN (GLUCOPHAGE) 850 MG tablet    insulin glargine (LANTUS) 100 UNIT/ML injection    insulin aspart (NOVOLOG) 100 UNIT/ML injection   3. Diabetes mellitus, type II  sitaGLIPtin (JANUVIA) 100 MG tablet   4. Barrett esophagus  esomeprazole (NEXIUM) 40 MG capsule    Ambulatory referral to Gastroenterology   5. Depression  escitalopram (LEXAPRO) 10 MG tablet   6. Prinzmetal angina  diazepam (VALIUM) 2 MG tablet   7. Sinusitis  fluticasone (FLONASE) 50 MCG/ACT nasal spray    amoxicillin-clavulanate (AUGMENTIN) 875-125 MG per tablet    DISCONTINUED: amoxicillin-clavulanate (AUGMENTIN) 875-125 MG per tablet    DISCONTINUED: fluticasone (FLONASE) 50 MCG/ACT nasal spray   8. CAD (coronary artery disease)  nitroglycerin (NITROSTAT) 0.4 MG SL tablet     A1C : 6.3  Plan:   Current treatment plan is effective, no change in therapy.  Reviewed diet, exercise and weight control.  Recommended sodium restriction.  Copy of written low fat low cholesterol diet provided and reviewed.  Cardiovascular risk and specific lipid/LDL goals reviewed.  Reviewed medications and side effects in detai  Inc lantus to 120 units bid, Novolog SSI    con't current meds  .  F/u with Dr Deetta Perla,  Body mass index is 49.88 kg/(m^2).  Discussed the patient's BMI with him.  The BMI is above average; BMI management plan is completed.  Pt to see [place] weight loss program  May be a good candidate for my ideal protein diet plan   RTC prn, in 3 months o/w   >1 hour spent with Pt, >30 spent in coordinating care and counseling

## 2013-12-28 LAB — LIPID PANEL
Cholesterol / HDL Ratio: 3 (ref 0.0–5.0)
Cholesterol: 131 MG/DL (ref 125–200)
HDL: 44 mg/dL (ref >=40)
LDL Calculated: 74 mg/dL (ref <130)
Non HDL Cholesterol (LDL and VLDL): 87 mg/dL
Triglycerides: 66 MG/DL (ref <150)

## 2013-12-30 ENCOUNTER — Other Ambulatory Visit (INDEPENDENT_AMBULATORY_CARE_PROVIDER_SITE_OTHER): Payer: Self-pay | Admitting: Student in an Organized Health Care Education/Training Program

## 2013-12-30 ENCOUNTER — Telehealth (INDEPENDENT_AMBULATORY_CARE_PROVIDER_SITE_OTHER): Payer: Self-pay | Admitting: Student in an Organized Health Care Education/Training Program

## 2013-12-30 DIAGNOSIS — I2510 Atherosclerotic heart disease of native coronary artery without angina pectoris: Secondary | ICD-10-CM

## 2013-12-30 DIAGNOSIS — I10 Essential (primary) hypertension: Secondary | ICD-10-CM

## 2013-12-30 MED ORDER — ISOSORBIDE MONONITRATE ER 60 MG PO TB24
ORAL_TABLET | ORAL | Status: DC
Start: 2013-12-30 — End: 2014-03-31

## 2013-12-30 NOTE — Telephone Encounter (Signed)
Pt aware.

## 2013-12-30 NOTE — Telephone Encounter (Signed)
Sent      Thanks     Curry Dulski

## 2013-12-30 NOTE — Telephone Encounter (Signed)
Pt seen on Friday 12/28/2013  Pt stated he did not receive his IMDUR rx   Can this be called into his phamracy?  [facility] the [employer] [phone]   He has one dose left for today

## 2014-03-31 ENCOUNTER — Ambulatory Visit (INDEPENDENT_AMBULATORY_CARE_PROVIDER_SITE_OTHER)
Payer: No Typology Code available for payment source | Admitting: Student in an Organized Health Care Education/Training Program

## 2014-03-31 ENCOUNTER — Encounter (INDEPENDENT_AMBULATORY_CARE_PROVIDER_SITE_OTHER): Payer: Self-pay | Admitting: Student in an Organized Health Care Education/Training Program

## 2014-03-31 VITALS — BP 113/75 | Temp 97.5°F | Ht 68.0 in | Wt 337.0 lb

## 2014-03-31 DIAGNOSIS — F3289 Other specified depressive episodes: Secondary | ICD-10-CM

## 2014-03-31 DIAGNOSIS — E119 Type 2 diabetes mellitus without complications: Secondary | ICD-10-CM

## 2014-03-31 DIAGNOSIS — G589 Mononeuropathy, unspecified: Secondary | ICD-10-CM

## 2014-03-31 DIAGNOSIS — F32A Depression, unspecified: Secondary | ICD-10-CM

## 2014-03-31 DIAGNOSIS — I2510 Atherosclerotic heart disease of native coronary artery without angina pectoris: Secondary | ICD-10-CM

## 2014-03-31 DIAGNOSIS — G629 Polyneuropathy, unspecified: Secondary | ICD-10-CM

## 2014-03-31 DIAGNOSIS — I10 Essential (primary) hypertension: Secondary | ICD-10-CM

## 2014-03-31 DIAGNOSIS — K2270 Barrett's esophagus without dysplasia: Secondary | ICD-10-CM

## 2014-03-31 LAB — POCT GLYCOSYLATED HEMOGLOBIN (HGB A1C): POCT Hgb A1C: 6.9 — AB (ref 3.9–5.9)

## 2014-03-31 MED ORDER — INSULIN GLARGINE 100 UNIT/ML SC SOLN
SUBCUTANEOUS | Status: DC
Start: 2014-03-31 — End: 2014-06-08

## 2014-03-31 MED ORDER — ESOMEPRAZOLE MAGNESIUM 40 MG PO CPDR
40.0000 mg | DELAYED_RELEASE_CAPSULE | Freq: Every day | ORAL | Status: DC
Start: 2014-03-31 — End: 2014-06-26

## 2014-03-31 MED ORDER — METFORMIN HCL 850 MG PO TABS
850.0000 mg | ORAL_TABLET | Freq: Three times a day (TID) | ORAL | Status: DC
Start: 2014-03-31 — End: 2016-11-04

## 2014-03-31 MED ORDER — INSULIN ASPART 100 UNIT/ML SC SOLN
SUBCUTANEOUS | Status: DC
Start: 2014-03-31 — End: 2014-06-26

## 2014-03-31 MED ORDER — SITAGLIPTIN PHOSPHATE 100 MG PO TABS
100.0000 mg | ORAL_TABLET | Freq: Every day | ORAL | Status: DC
Start: 2014-03-31 — End: 2014-06-26

## 2014-03-31 MED ORDER — ESCITALOPRAM OXALATE 10 MG PO TABS
10.0000 mg | ORAL_TABLET | Freq: Every day | ORAL | Status: DC
Start: 2014-03-31 — End: 2014-06-26

## 2014-03-31 MED ORDER — LISINOPRIL 2.5 MG PO TABS
2.5000 mg | ORAL_TABLET | Freq: Every day | ORAL | Status: DC
Start: 2014-03-31 — End: 2014-06-26

## 2014-03-31 MED ORDER — INSULIN ASPART 100 UNIT/ML SC SOLN
SUBCUTANEOUS | Status: DC
Start: 2014-03-31 — End: 2014-03-31

## 2014-03-31 MED ORDER — ISOSORBIDE MONONITRATE ER 60 MG PO TB24
ORAL_TABLET | ORAL | Status: DC
Start: 2014-03-31 — End: 2014-06-26

## 2014-03-31 MED ORDER — AMLODIPINE BESYLATE 5 MG PO TABS
5.0000 mg | ORAL_TABLET | Freq: Every day | ORAL | Status: DC
Start: 2014-03-31 — End: 2014-06-26

## 2014-03-31 NOTE — Progress Notes (Signed)
Subjective:   Brent Palmer is a 48 y.o. male with the following    Hypertension: very  compliant    - The patient feels this issue is  controlled.  - The patient has been monitoring blood pressure.  - The patient has  been adhering to the recommended no-added-salt diet.  - The patient has not been compliant to the recommended exercise program.  - The patient is  tolerating the current medication regimen well.  - The patient denies orthostasis.  - The patient denies dyspnea.  - The patient denies edema.      insulin-requiring diabetes, taking lantus 110 bid, novolog 50 TID (from 60), metformin 850 mg tid, januvia 100 mg qday, no hypoglycemic Sx, he did not bring his BS log, he continue to gain weight and feels he is getting resistant to Insulin, is still planning on seeing Dr Orma Flaming, but holding off due to copay   asthma, stable   Anxiety:  no HI/SI currently, lexapro 10 mg qday  Works really well except for the "dullness"   He has h/o Prinzmetal's angina.  in 2008 he first developed chest discomfort symptoms. Eventually this led to a cardiac catheterization which revealed coronary spasm only. No significant coronary disease was noted.   Chest Pain:  Fully Better after he started taking nexium  .    He continues to snore and frequently naps due to his fatigue , in 2005, he had a sleep study that did not show significant sleep apnea. Since that time however he's gained a significant amount of weight, BMI: 50. He tells me that his wife does report that he often stops breathing at night. He does have excessive daytime sleepiness and takes naps. Still planning on getting sleep study, flonase helped some    Neuropathy: He describes symptoms of numbness. Onset of symptoms was gradual, starting about 1 month ago. Symptoms are currently of R heel severity. Symptoms occur all day, The patient denies squeezing and allodynia. Symptoms are worse on the right side. Previous treatment has included none,        Current Outpatient Prescriptions   Medication Sig Dispense Refill   . albuterol (PROAIR HFA) 108 (90 BASE) MCG/ACT inhaler Inhale 2 puffs into the lungs every 6 (six) hours as needed for Wheezing or Shortness of Breath.  1 Inhaler  0   . Albuterol Sulfate (PROAIR HFA IN) Inhale into the lungs.       Marland Kitchen amLODIPine (NORVASC) 5 MG tablet Take 1 tablet (5 mg total) by mouth daily.  90 tablet  0   . B-D INS SYR ULTRAFINE 1CC/31G 31G X 5/16" 1 ML Misc        . dextrose ([employer] GLUCOSE) 40 % Gel Take 15 g by mouth as needed.       Marland Kitchen esomeprazole (NEXIUM) 40 MG capsule Take 1 capsule (40 mg total) by mouth daily.  90 capsule  1   . furosemide (LASIX) 20 MG tablet Take 20 mg by mouth 2 (two) times daily.       . insulin aspart (NOVOLOG) 100 UNIT/ML injection Inject 60 Units into the skin 3 (three) times daily before meals.  17 pen  0   . insulin glargine (LANTUS) 100 UNIT/ML injection 110 units bid  20 pen  0   . isosorbide mononitrate (IMDUR) 60 MG 24 hr tablet 1 tab 1/2 po every day  135 tablet  0   . lisinopril (PRINIVIL,ZESTRIL) 2.5 MG tablet Take 1 tablet (2.5 mg  total) by mouth daily.  90 tablet  0   . metFORMIN (GLUCOPHAGE) 850 MG tablet Take 1 tablet (850 mg total) by mouth 3 (three) times daily.  270 tablet  0   . nitroglycerin (NITROSTAT) 0.4 MG SL tablet Place 1 tablet (0.4 mg total) under the tongue every 5 (five) minutes as needed.  90 tablet  0   . omeprazole (PRILOSEC) 20 MG capsule Take 20 mg by mouth daily.       . pantoprazole (PROTONIX) 40 MG tablet Take 1 tablet (40 mg total) by mouth daily.  90 tablet  0   . potassium chloride SA (K-DUR,KLOR-CON) 20 MEQ tablet Take 20 mEq by mouth 2 (two) times daily.       Marland Kitchen venlafaxine (EFFEXOR) 75 MG tablet Take 1 tablet (75 mg total) by mouth daily.  90 tablet  0      Hypertension ROS: taking medications as instructed, no medication side effects noted, no TIA's, +chest pain on exertion, no dyspnea on exertion and no swelling of ankles.   . Constitutional: +  fatigue,NO  fever, chills   Eyes: negative for vision problems   Ears, nose, mouth, throat, and face: negative for nose bleeds, nasal congestion   Respiratory: negative for SOB, cough , wheezing   Cardiovascular: negative for CP, dizziness, palpitations, swelling , orthopnea   Gastrointestinal: negative for diarrhea , nausea, vomiting, constipation, + HEART BURN    Genitourinary:negative for dysuria, frequency and hematuria   Integument/breast: negative for nodule   Hematologic/lymphatic: negative for enlarged LAD,   Musculoskeletal:negative for myalgia, joint pain and swelling   Neurological: negative for HA, dizziness, tingling ,   Behavioral/Psych: negative for anxiety, depression   Endocrine: + weight gain/better on lexapro , NO weight loss, striae, skin pigmentation   Other ROS were reviewed and were neg           Objective:   Filed Vitals:    03/31/14 1024   BP: 113/75   Temp: 97.5 F (36.4 C)   SpO2: 97%       CONSTITUTIONAL Patient is afebrile, Vital signs reviewed. Normo tensive. Very obese, BMI: 50  HEAD Atraumatic, Normocephallc.   EYES Eyes are normal to inspection, PERRL, No discharge from eyes,   ENT Ears normal to inspection, Nose examination normal, Posterior pharynx normal.   NECK Normal ROM, No jugular venous distention, No meningeal signs.   RESPIRATORY CHEST Chest is nontender, Breath sounds normal.   CARDIOVASCULAR RRR, Heart sounds normal, Normal S1 S2.   ABDOMEN Abdomen is tender over epigastric area , No pulsatile masses, No other masses,   Bowel sounds normal, No distension, No peritoneal signs.   BACK There is no CVA Tenderness, There is no tenderness to palpation.   UPPER EXTREMITY Inspection normal, No cyanosis.   LOWER EXTREMITY Inspection normal, No cyanosis.   NEURO Cranial nerves intact, No cerebellar deficits, Normal DTRs, Babinski absent, Speech normal, Memory normal   SKIN Skin is warm, Skin is dry, Skin is normal color   LYMPHATIC No adenopathy in neck.   PSYCHIATRIC Oriented X  3, Normal affect. Normal insight.    Assessment:    1. DM (diabetes mellitus)  POCT Glycosylated Hemoglobin (A1C)    metFORMIN (GLUCOPHAGE) 850 MG tablet    insulin glargine (LANTUS) 100 UNIT/ML injection    insulin aspart (NOVOLOG) 100 UNIT/ML injection    DISCONTINUED: insulin aspart (NOVOLOG) 100 UNIT/ML injection   2. HTN (hypertension)  lisinopril (PRINIVIL,ZESTRIL) 2.5 MG tablet  amLODIPine (NORVASC) 5 MG tablet    isosorbide mononitrate (IMDUR) 60 MG 24 hr tablet   3. Diabetes mellitus, type II  sitaGLIPtin (JANUVIA) 100 MG tablet   4. CAD (coronary artery disease)  isosorbide mononitrate (IMDUR) 60 MG 24 hr tablet   5. Barrett esophagus  esomeprazole (NEXIUM) 40 MG capsule   6. Depression  escitalopram (LEXAPRO) 10 MG tablet     7 neuropathy : he will start B complex and consider neurontin if worse     A1C : 6.9, he will go back to novolog 60 units tid , he will continue lantus 110 bid   Plan:   meds refilled   Potential medication side effects were discussed with the patient; let me know if any occur.  Reviewed diet, exercise and weight control.  Recommended sodium restriction.  Copy of written low fat low cholesterol diet provided and reviewed.  Cardiovascular risk and specific lipid/LDL goals reviewed.  Reviewed medications and side effects in detai   con't current meds  .  F/u with Dr Deetta Perla,  Body mass index is 51.25 kg/(m^2). he was advised to think about gastric bypass surgery   Discussed the patient's BMI with him.  The BMI is above average; BMI management plan is completed.  Pt to see [place] weight loss program   RTC prn, in 3 months o/w   >1 hour spent with Pt, >30 spent in coordinating care and counseling

## 2014-03-31 NOTE — Progress Notes (Signed)
Have you seen any new specialists/physicians since you were last here?    No

## 2014-04-07 ENCOUNTER — Ambulatory Visit (INDEPENDENT_AMBULATORY_CARE_PROVIDER_SITE_OTHER): Payer: No Typology Code available for payment source

## 2014-04-07 DIAGNOSIS — Z111 Encounter for screening for respiratory tuberculosis: Secondary | ICD-10-CM

## 2014-04-07 NOTE — Progress Notes (Signed)
Patient presented to the office for TB screening.  The PPD was placed on the inner aspect of the LEFT arm.  Patient tolerated procedure and left in good condition.

## 2014-04-09 ENCOUNTER — Encounter (INDEPENDENT_AMBULATORY_CARE_PROVIDER_SITE_OTHER): Payer: Self-pay

## 2014-04-09 ENCOUNTER — Ambulatory Visit (INDEPENDENT_AMBULATORY_CARE_PROVIDER_SITE_OTHER): Payer: No Typology Code available for payment source

## 2014-04-09 DIAGNOSIS — Z111 Encounter for screening for respiratory tuberculosis: Secondary | ICD-10-CM

## 2014-04-09 DIAGNOSIS — Z09 Encounter for follow-up examination after completed treatment for conditions other than malignant neoplasm: Secondary | ICD-10-CM

## 2014-04-09 LAB — TB SKIN TEST
Induration: 0
TB Skin Test: NEGATIVE mm

## 2014-04-09 NOTE — Progress Notes (Signed)
Patient presented to the office for PPD reading.  Results were NEGATIVE

## 2014-06-08 ENCOUNTER — Other Ambulatory Visit (INDEPENDENT_AMBULATORY_CARE_PROVIDER_SITE_OTHER): Payer: Self-pay | Admitting: Student in an Organized Health Care Education/Training Program

## 2014-06-26 ENCOUNTER — Encounter (INDEPENDENT_AMBULATORY_CARE_PROVIDER_SITE_OTHER): Payer: Self-pay | Admitting: Student in an Organized Health Care Education/Training Program

## 2014-06-26 ENCOUNTER — Ambulatory Visit (INDEPENDENT_AMBULATORY_CARE_PROVIDER_SITE_OTHER)
Payer: No Typology Code available for payment source | Admitting: Student in an Organized Health Care Education/Training Program

## 2014-06-26 VITALS — BP 122/78 | HR 90 | Temp 97.4°F | Ht 68.0 in | Wt 343.0 lb

## 2014-06-26 DIAGNOSIS — F3289 Other specified depressive episodes: Secondary | ICD-10-CM

## 2014-06-26 DIAGNOSIS — F32A Depression, unspecified: Secondary | ICD-10-CM

## 2014-06-26 DIAGNOSIS — I10 Essential (primary) hypertension: Secondary | ICD-10-CM

## 2014-06-26 DIAGNOSIS — E119 Type 2 diabetes mellitus without complications: Secondary | ICD-10-CM

## 2014-06-26 DIAGNOSIS — I2510 Atherosclerotic heart disease of native coronary artery without angina pectoris: Secondary | ICD-10-CM

## 2014-06-26 DIAGNOSIS — K2270 Barrett's esophagus without dysplasia: Secondary | ICD-10-CM

## 2014-06-26 MED ORDER — INSULIN GLARGINE 100 UNIT/ML SC SOLN
SUBCUTANEOUS | Status: DC
Start: 2014-06-26 — End: 2016-11-04

## 2014-06-26 MED ORDER — ESOMEPRAZOLE MAGNESIUM 40 MG PO CPDR
40.0000 mg | DELAYED_RELEASE_CAPSULE | Freq: Every day | ORAL | Status: AC
Start: 2014-06-26 — End: 2015-06-26

## 2014-06-26 MED ORDER — SITAGLIPTIN PHOSPHATE 100 MG PO TABS
100.0000 mg | ORAL_TABLET | Freq: Every day | ORAL | Status: DC
Start: 2014-06-26 — End: 2016-11-04

## 2014-06-26 MED ORDER — AMLODIPINE BESYLATE 5 MG PO TABS
5.0000 mg | ORAL_TABLET | Freq: Every day | ORAL | Status: DC
Start: 2014-06-26 — End: 2016-11-04

## 2014-06-26 MED ORDER — ESCITALOPRAM OXALATE 10 MG PO TABS
10.0000 mg | ORAL_TABLET | Freq: Every day | ORAL | Status: DC
Start: 2014-06-26 — End: 2017-01-16

## 2014-06-26 MED ORDER — NITROGLYCERIN 0.4 MG SL SUBL
0.4000 mg | SUBLINGUAL_TABLET | SUBLINGUAL | Status: AC | PRN
Start: 2014-06-26 — End: ?

## 2014-06-26 MED ORDER — INSULIN ASPART 100 UNIT/ML SC SOLN
SUBCUTANEOUS | Status: DC
Start: 2014-06-26 — End: 2016-11-04

## 2014-06-26 MED ORDER — LISINOPRIL 2.5 MG PO TABS
2.5000 mg | ORAL_TABLET | Freq: Every day | ORAL | Status: DC
Start: 2014-06-26 — End: 2016-11-04

## 2014-06-26 MED ORDER — ISOSORBIDE MONONITRATE ER 60 MG PO TB24
ORAL_TABLET | ORAL | Status: DC
Start: 2014-06-26 — End: 2016-12-20

## 2014-06-29 NOTE — Progress Notes (Signed)
Subjective:   Brent Palmer is a 48 y.o. male with the following    Hypertension: very  compliant    - The patient feels this issue is  controlled.  - The patient has been monitoring blood pressure.  - The patient has  been adhering to the recommended no-added-salt diet.  - The patient has not been compliant to the recommended exercise program.  - The patient is  tolerating the current medication regimen well.  - The patient denies orthostasis.  - The patient denies dyspnea.  - The patient denies edema.      insulin-requiring diabetes, taking lantus 110 bid, novolog 50 TID (from 60), metformin 850 mg tid, januvia 100 mg qday, no hypoglycemic Sx, he did not bring his BS log, he continue to gain weight and feels he is getting resistant to Insulin, is still planning on seeing Dr Orma Flaming, but holding off due to copay   asthma, stable   Anxiety:  no HI/SI currently, lexapro 10 mg qday  Works really well except for the "dullness"  He has h/o Prinzmetal's angina.  in 2008 he first developed chest discomfort symptoms. Eventually this led to a cardiac catheterization which revealed coronary spasm only. No significant coronary disease was noted.  Neuropathy: He describes symptoms of numbness. Onset of symptoms was gradual, starting about 1 month ago. Symptoms are currently of R heel severity. Symptoms occur all day, The patient denies squeezing and allodynia. Symptoms are worse on the right side. Previous treatment has included none,   Employer is changing insurance to [place]     Current Outpatient Prescriptions   Medication Sig Dispense Refill   . albuterol (PROAIR HFA) 108 (90 BASE) MCG/ACT inhaler Inhale 2 puffs into the lungs every 6 (six) hours as needed for Wheezing or Shortness of Breath.  1 Inhaler  0   . Albuterol Sulfate (PROAIR HFA IN) Inhale into the lungs.       Marland Kitchen amLODIPine (NORVASC) 5 MG tablet Take 1 tablet (5 mg total) by mouth daily.  90 tablet  0   . B-D INS SYR ULTRAFINE 1CC/31G 31G X 5/16" 1  ML Misc        . dextrose ([employer] GLUCOSE) 40 % Gel Take 15 g by mouth as needed.       Marland Kitchen esomeprazole (NEXIUM) 40 MG capsule Take 1 capsule (40 mg total) by mouth daily.  90 capsule  1   . furosemide (LASIX) 20 MG tablet Take 20 mg by mouth 2 (two) times daily.       . insulin aspart (NOVOLOG) 100 UNIT/ML injection Inject 60 Units into the skin 3 (three) times daily before meals.  17 pen  0   . insulin glargine (LANTUS) 100 UNIT/ML injection 110 units bid  20 pen  0   . isosorbide mononitrate (IMDUR) 60 MG 24 hr tablet 1 tab 1/2 po every day  135 tablet  0   . lisinopril (PRINIVIL,ZESTRIL) 2.5 MG tablet Take 1 tablet (2.5 mg total) by mouth daily.  90 tablet  0   . metFORMIN (GLUCOPHAGE) 850 MG tablet Take 1 tablet (850 mg total) by mouth 3 (three) times daily.  270 tablet  0   . nitroglycerin (NITROSTAT) 0.4 MG SL tablet Place 1 tablet (0.4 mg total) under the tongue every 5 (five) minutes as needed.  90 tablet  0   . omeprazole (PRILOSEC) 20 MG capsule Take 20 mg by mouth daily.       . pantoprazole (PROTONIX)  40 MG tablet Take 1 tablet (40 mg total) by mouth daily.  90 tablet  0   . potassium chloride SA (K-DUR,KLOR-CON) 20 MEQ tablet Take 20 mEq by mouth 2 (two) times daily.       Marland Kitchen venlafaxine (EFFEXOR) 75 MG tablet Take 1 tablet (75 mg total) by mouth daily.  90 tablet  0      Hypertension ROS: taking medications as instructed, no medication side effects noted, no TIA's, +chest pain on exertion, no dyspnea on exertion and no swelling of ankles.   . Constitutional: + fatigue,NO  fever, chills   Eyes: negative for vision problems   Ears, nose, mouth, throat, and face: negative for nose bleeds, nasal congestion   Respiratory: negative for SOB, cough , wheezing   Cardiovascular: negative for CP, dizziness, palpitations, swelling , orthopnea   Gastrointestinal: negative for diarrhea , nausea, vomiting, constipation, + HEART BURN    Genitourinary:negative for dysuria, frequency and hematuria   Integument/breast:  negative for nodule   Hematologic/lymphatic: negative for enlarged LAD,   Musculoskeletal:negative for myalgia, joint pain and swelling   Neurological: negative for HA, dizziness, tingling ,   Behavioral/Psych: negative for anxiety, depression   Endocrine: + weight gain/better on lexapro , NO weight loss, striae, skin pigmentation   Other ROS were reviewed and were neg           Objective:   Filed Vitals:    03/31/14 1024   BP: 113/75   Temp: 97.5 F (36.4 C)   SpO2: 97%       CONSTITUTIONAL Patient is afebrile, Vital signs reviewed. Normo tensive. Very obese, BMI: 50  HEAD Atraumatic, Normocephallc.   EYES Eyes are normal to inspection, PERRL, No discharge from eyes,   ENT Ears normal to inspection, Nose examination normal, Posterior pharynx normal.   NECK Normal ROM, No jugular venous distention, No meningeal signs.   RESPIRATORY CHEST Chest is nontender, Breath sounds normal.   CARDIOVASCULAR RRR, Heart sounds normal, Normal S1 S2.   ABDOMEN Abdomen is tender over epigastric area , No pulsatile masses, No other masses,   Bowel sounds normal, No distension, No peritoneal signs.   BACK There is no CVA Tenderness, There is no tenderness to palpation.   UPPER EXTREMITY Inspection normal, No cyanosis.   LOWER EXTREMITY Inspection normal, No cyanosis.   NEURO Cranial nerves intact, No cerebellar deficits, Normal DTRs, Babinski absent, Speech normal, Memory normal   SKIN Skin is warm, Skin is dry, Skin is normal color   LYMPHATIC No adenopathy in neck.   PSYCHIATRIC Oriented X 3, Normal affect. Normal insight.    Assessment:      1. Barrett esophagus  esomeprazole (NEXIUM) 40 MG capsule   2. Coronary artery disease involving native coronary artery of native heart without angina pectoris  nitroglycerin (NITROSTAT) 0.4 MG SL tablet    isosorbide mononitrate (IMDUR) 60 MG 24 hr tablet   3. Depression  escitalopram (LEXAPRO) 10 MG tablet   4. Type 2 diabetes mellitus without complication  sitaGLIPtin (JANUVIA) 100 MG  tablet    insulin glargine (LANTUS) 100 UNIT/ML injection    insulin aspart (NOVOLOG) 100 UNIT/ML injection   5. Essential hypertension  amLODIPine (NORVASC) 5 MG tablet    lisinopril (PRINIVIL,ZESTRIL) 2.5 MG tablet    isosorbide mononitrate (IMDUR) 60 MG 24 hr tablet       6. DM (diabetes mellitus)  POCT Glycosylated Hemoglobin (A1C)    metFORMIN (GLUCOPHAGE) 850 MG tablet    insulin  glargine (LANTUS) 100 UNIT/ML injection    insulin aspart (NOVOLOG) 100 UNIT/ML injection    DISCONTINUED: insulin aspart (NOVOLOG) 100 UNIT/ML injection       Plan:   meds refilled   Potential medication side effects were discussed with the patient; let me know if any occur.  Reviewed diet, exercise and weight control.  Recommended sodium restriction.  Copy of written low fat low cholesterol diet provided and reviewed.  Cardiovascular risk and specific lipid/LDL goals reviewed.  Reviewed medications and side effects in detai   con't current meds  .  Body mass index is 51.25 kg/(m^2). he was advised to think about gastric bypass surgery   Discussed the patient's BMI with him.  The BMI is above average; BMI management plan is completed.  Pt to see [place] weight loss program   He will f/u with Mikael Spray PMD after he changed his insurance

## 2014-07-09 ENCOUNTER — Telehealth (INDEPENDENT_AMBULATORY_CARE_PROVIDER_SITE_OTHER): Payer: Self-pay | Admitting: Student in an Organized Health Care Education/Training Program

## 2014-07-09 ENCOUNTER — Encounter (INDEPENDENT_AMBULATORY_CARE_PROVIDER_SITE_OTHER): Payer: Self-pay | Admitting: Student in an Organized Health Care Education/Training Program

## 2014-07-09 NOTE — Telephone Encounter (Signed)
Pt aware form is ready.     Pt was on his way to pick up forms.

## 2014-07-09 NOTE — Telephone Encounter (Signed)
Pt called for status on the forms that he left and needs for "fit for duty"   Pt phone [phone]

## 2014-07-09 NOTE — Telephone Encounter (Signed)
Written     Thanks     Takeysha Bonk

## 2014-07-09 NOTE — Telephone Encounter (Signed)
Patient would like a call regarding the status of the "Fit For Duty" forms he left at the office

## 2016-08-26 ENCOUNTER — Other Ambulatory Visit: Payer: Self-pay

## 2016-08-26 MED ORDER — METFORMIN HCL 850 MG PO TABS
850.0000 mg | ORAL_TABLET | Freq: Three times a day (TID) | ORAL | 1 refills | Status: AC
Start: 2016-08-26 — End: ?
  Filled 2016-08-26 – 2016-12-15 (×2): qty 270, 90d supply, fill #0

## 2016-08-26 MED ORDER — GLUCOSE BLOOD VI STRP
ORAL_STRIP | 1 refills | Status: DC
Start: 2016-08-26 — End: 2016-12-09
  Filled 2016-08-26: qty 100, 30d supply, fill #0

## 2016-08-26 MED ORDER — RANITIDINE HCL 300 MG PO TABS
300.0000 mg | ORAL_TABLET | Freq: Every day | ORAL | 3 refills | Status: DC
Start: 2016-08-26 — End: 2016-11-04
  Filled 2016-08-26: qty 30, 30d supply, fill #0

## 2016-08-26 MED ORDER — LISINOPRIL 5 MG PO TABS
5.0000 mg | ORAL_TABLET | Freq: Every day | ORAL | 1 refills | Status: AC
Start: 2016-08-26 — End: ?
  Filled 2016-08-26 – 2016-12-01 (×2): qty 90, 90d supply, fill #0

## 2016-08-26 MED ORDER — SUCRALFATE 1 G PO TABS
ORAL_TABLET | ORAL | 1 refills | Status: DC
Start: 2016-08-26 — End: 2016-11-04
  Filled 2016-08-26: qty 120, 30d supply, fill #0

## 2016-08-26 MED ORDER — ATORVASTATIN CALCIUM 40 MG PO TABS
40.0000 mg | ORAL_TABLET | Freq: Every day | ORAL | 1 refills | Status: DC
Start: 2016-08-26 — End: 2016-12-07
  Filled 2016-08-26: qty 90, 90d supply, fill #0

## 2016-08-26 MED ORDER — ISOSORBIDE MONONITRATE ER 30 MG PO TB24
90.0000 mg | ORAL_TABLET | Freq: Every day | ORAL | 1 refills | Status: DC
Start: 2016-08-26 — End: 2017-01-16
  Filled 2016-08-26 – 2016-10-28 (×2): qty 270, 90d supply, fill #0

## 2016-08-26 MED ORDER — ESCITALOPRAM OXALATE 10 MG PO TABS
10.0000 mg | ORAL_TABLET | Freq: Every day | ORAL | 1 refills | Status: DC
Start: 2016-08-26 — End: 2016-11-04
  Filled 2016-08-26 – 2016-10-28 (×2): qty 90, 90d supply, fill #0

## 2016-08-26 MED ORDER — AMLODIPINE BESYLATE 5 MG PO TABS
5.0000 mg | ORAL_TABLET | Freq: Every day | ORAL | 1 refills | Status: DC
Start: 2016-08-26 — End: 2016-12-15
  Filled 2016-08-26: qty 90, 90d supply, fill #0

## 2016-08-26 MED ORDER — ONETOUCH DELICA LANCETS 33G MISC
1 refills | Status: DC
Start: 2016-08-26 — End: 2016-12-09
  Filled 2016-08-26: qty 100, 30d supply, fill #0

## 2016-08-27 ENCOUNTER — Other Ambulatory Visit: Payer: Self-pay

## 2016-08-29 ENCOUNTER — Other Ambulatory Visit: Payer: Self-pay

## 2016-08-29 MED ORDER — INSULIN GLARGINE 100 UNIT/ML SC SOLN
135.0000 [IU] | Freq: Two times a day (BID) | SUBCUTANEOUS | 1 refills | Status: DC
Start: 2016-08-29 — End: 2016-11-04
  Filled 2016-08-29: qty 240, 89d supply, fill #0

## 2016-08-29 MED ORDER — INSULIN LISPRO 100 UNIT/ML SC SOLN
45.0000 [IU] | Freq: Three times a day (TID) | SUBCUTANEOUS | 1 refills | Status: DC
Start: 2016-08-29 — End: 2016-11-04
  Filled 2016-08-29: qty 150, 90d supply, fill #0

## 2016-08-30 ENCOUNTER — Other Ambulatory Visit: Payer: Self-pay

## 2016-08-31 ENCOUNTER — Other Ambulatory Visit: Payer: Self-pay

## 2016-09-17 ENCOUNTER — Other Ambulatory Visit: Payer: Self-pay

## 2016-09-23 ENCOUNTER — Other Ambulatory Visit: Payer: Self-pay

## 2016-10-28 ENCOUNTER — Other Ambulatory Visit: Payer: Self-pay

## 2016-11-03 ENCOUNTER — Ambulatory Visit (INDEPENDENT_AMBULATORY_CARE_PROVIDER_SITE_OTHER): Payer: Commercial Managed Care - HMO | Admitting: Family Medicine

## 2016-11-04 ENCOUNTER — Ambulatory Visit (INDEPENDENT_AMBULATORY_CARE_PROVIDER_SITE_OTHER): Payer: Commercial Managed Care - HMO | Admitting: Family Medicine

## 2016-11-04 ENCOUNTER — Other Ambulatory Visit: Payer: Self-pay

## 2016-11-04 ENCOUNTER — Encounter (INDEPENDENT_AMBULATORY_CARE_PROVIDER_SITE_OTHER): Payer: Self-pay | Admitting: Family Medicine

## 2016-11-04 VITALS — BP 141/80 | HR 105 | Temp 99.0°F | Ht 67.72 in | Wt 377.0 lb

## 2016-11-04 DIAGNOSIS — I201 Angina pectoris with documented spasm: Secondary | ICD-10-CM

## 2016-11-04 DIAGNOSIS — K2270 Barrett's esophagus without dysplasia: Secondary | ICD-10-CM

## 2016-11-04 DIAGNOSIS — Z135 Encounter for screening for eye and ear disorders: Secondary | ICD-10-CM

## 2016-11-04 DIAGNOSIS — K2100 Gastro-esophageal reflux disease with esophagitis, without bleeding: Secondary | ICD-10-CM

## 2016-11-04 DIAGNOSIS — J4520 Mild intermittent asthma, uncomplicated: Secondary | ICD-10-CM

## 2016-11-04 DIAGNOSIS — Z1211 Encounter for screening for malignant neoplasm of colon: Secondary | ICD-10-CM

## 2016-11-04 DIAGNOSIS — E7800 Pure hypercholesterolemia, unspecified: Secondary | ICD-10-CM

## 2016-11-04 DIAGNOSIS — E119 Type 2 diabetes mellitus without complications: Secondary | ICD-10-CM

## 2016-11-04 DIAGNOSIS — F418 Other specified anxiety disorders: Secondary | ICD-10-CM

## 2016-11-04 DIAGNOSIS — Z1389 Encounter for screening for other disorder: Secondary | ICD-10-CM

## 2016-11-04 DIAGNOSIS — Z1331 Encounter for screening for depression: Secondary | ICD-10-CM

## 2016-11-04 DIAGNOSIS — I10 Essential (primary) hypertension: Secondary | ICD-10-CM

## 2016-11-04 DIAGNOSIS — I252 Old myocardial infarction: Secondary | ICD-10-CM

## 2016-11-04 LAB — COMPREHENSIVE METABOLIC PANEL
ALT: 60 U/L — ABNORMAL HIGH (ref 0–55)
AST (SGOT): 42 U/L — ABNORMAL HIGH (ref 5–34)
Albumin/Globulin Ratio: 1.1 (ref 0.9–2.2)
Albumin: 3.6 g/dL (ref 3.5–5.0)
Alkaline Phosphatase: 89 U/L (ref 38–106)
BUN: 17 mg/dL (ref 9.0–28.0)
Bilirubin, Total: 0.5 mg/dL (ref 0.1–1.2)
CO2: 28 mEq/L (ref 21–29)
Calcium: 9 mg/dL (ref 8.5–10.5)
Chloride: 102 mEq/L (ref 100–111)
Creatinine: 0.8 mg/dL (ref 0.5–1.5)
Globulin: 3.2 g/dL (ref 2.0–3.7)
Glucose: 133 mg/dL — ABNORMAL HIGH (ref 70–100)
Potassium: 4.3 mEq/L (ref 3.5–5.1)
Protein, Total: 6.8 g/dL (ref 6.0–8.3)
Sodium: 140 mEq/L (ref 136–145)

## 2016-11-04 LAB — HEMOLYSIS INDEX: Hemolysis Index: 10 (ref 0–18)

## 2016-11-04 LAB — GFR: EGFR: >60

## 2016-11-04 LAB — HEMOGLOBIN A1C
Average Estimated Glucose: 177.2 mg/dL
Hemoglobin A1C: 7.8 % — ABNORMAL HIGH (ref 4.6–5.9)

## 2016-11-04 LAB — MICROALBUMIN, RANDOM URINE
Urine Creatinine, Random: 136.3 mg/dL
Urine Microalbumin, Random: 95 — ABNORMAL HIGH (ref 0.0–30.0)
Urine Microalbumin/Creatinine Ratio: 70 ug/mg — ABNORMAL HIGH (ref 0–30)

## 2016-11-04 MED ORDER — INSULIN DETEMIR 100 UNIT/ML SC SOLN
200.0000 [IU] | Freq: Two times a day (BID) | SUBCUTANEOUS | 0 refills | Status: DC
Start: 2016-11-04 — End: 2017-01-04
  Filled 2016-11-04: qty 360, 90d supply, fill #0

## 2016-11-04 MED ORDER — INSULIN LISPRO 100 UNIT/ML SC SOLN
45.0000 [IU] | Freq: Three times a day (TID) | SUBCUTANEOUS | 0 refills | Status: DC
Start: 2016-11-04 — End: 2016-12-09
  Filled 2016-11-04: qty 120, 89d supply, fill #0
  Filled 2016-11-05: qty 1900, 147d supply, fill #0
  Filled 2016-11-09: qty 130, 90d supply, fill #0

## 2016-11-04 NOTE — Progress Notes (Signed)
Chief Complaint   Patient presents with   . Establish Care         S:  First time visit for this patient here to discuss health issues:     Problem   Type 2 Diabetes Mellitus Without Complication, Without Long-Term Current Use of Insulin    Currently using 200 units lantus twice daily.  Also uses 50-60 units of humalog TID with meals.  On ACEi.  Last A1c about 3 months ago was 7.0.  Last eye exam about 18 months ago.  On metformin max dose.  Takes a daily ASA.  On lower dose of statin due to myalgia.     History of MI (Myocardial Infarction)    Reports this in 2012     Mixed Anxiety Depressive Disorder    Overview:   Gets homicidal thoughts if he tries to stop Lexapro     Hypercholesterolemia   Gastroesophageal Reflux Disease With Esophagitis   Morbid (Severe) Obesity Due to Excess Calories   Prinzmetal Angina    Uses daily norvasc which helps with controlling sx.     Barrett Esophagus    Last EGD in 2017.  Takes nexium daily which also helps with GERD.     Mild Intermittent Asthma Without Complication    Uses albuterol about once monthly when feeling well.  More frequently with URIs and then will need QVAR regularly as well.           No Known Allergies    Current Outpatient Prescriptions   Medication Sig Dispense Refill   . albuterol (PROAIR HFA) 108 (90 Base) MCG/ACT inhaler Inhale into the lungs.     Marland Kitchen aspirin 81 MG tablet Take 81 mg by mouth daily.     Marland Kitchen atorvastatin (LIPITOR) 40 MG tablet Take 1 tablet (40 mg total) by mouth daily for cholesterol. 90 tablet 1   . B-D INS SYR ULTRAFINE 1CC/31G 31G X 5/16" 1 ML Misc      . beclomethasone (QVAR) 80 MCG/ACT inhaler Inhale into the lungs.     Marland Kitchen escitalopram (LEXAPRO) 10 MG tablet Take 1 tablet (10 mg total) by mouth daily. 90 tablet 0   . esomeprazole (NEXIUM) 40 MG capsule Take 40 mg by mouth every morning before breakfast.     . glucose blood (ONETOUCH VERIO) test strip Use as directed twice daily to monitor blood glucose 100 each 1   . insulin lispro (HUMALOG)  100 UNIT/ML injection Inject 45 Units into the skin 3 (three) times daily before meals. 190 mL 0   . isosorbide mononitrate (IMDUR) 30 MG 24 hr tablet Take 3 tablets (90 mg total) by mouth daily. 270 tablet 1   . isosorbide mononitrate (IMDUR) 60 MG 24 hr tablet 1 tab 1/2 po every day 135 tablet 0   . lisinopril (PRINIVIL,ZESTRIL) 5 MG tablet Take 1 tablet (5 mg total) by mouth daily. 90 tablet 1   . metFORMIN (GLUCOPHAGE) 850 MG tablet Take 1 tablet (850 mg total) by mouth 3 (three) times daily with meals. 270 tablet 1   . nitroglycerin (NITROSTAT) 0.4 MG SL tablet Place 1 tablet (0.4 mg total) under the tongue every 5 (five) minutes as needed. 90 tablet 0   . ONETOUCH DELICA LANCETS 33G Misc Use as directed twice daily to monitor blood glucose 100 each 1   . amLODIPine (NORVASC) 5 MG tablet Take 1 tablet (5 mg total) by mouth daily. 90 tablet 1   . insulin detemir (LEVEMIR) 100 UNIT/ML injection  Inject 200 Units into the skin every 12 (twelve) hours. 360 mL 0     No current facility-administered medications for this visit.        Review of Systems   Constitutional: Negative for fever, malaise/fatigue and weight loss.   Cardiovascular: Negative for chest pain.   Gastrointestinal: Negative for nausea and vomiting.   Genitourinary: Negative for dysuria.   Musculoskeletal: Negative for myalgias.   Skin: Negative for rash.   Neurological: Negative for sensory change and headaches.   Endo/Heme/Allergies: Negative for polydipsia.   All other systems reviewed and are negative.        All other systems were reviewed and are negative    O:  BP 141/80 (BP Site: Right arm, Patient Position: Sitting)   Pulse (!) 105   Temp 99 F (37.2 C) (Tympanic)   Ht 1.72 m (5' 7.72")   Wt (!) 171 kg (377 lb)   SpO2 96%   BMI 57.80 kg/m , Body mass index is 57.8 kg/m.  Vitals reviewed  GEN:  NAD, morbidly obese  PSYCH:  Orientation, Insight/Judgement, Memory, Mood/Affect without deffecit  EYES:  Normal bulbar conjunctiva and  eyelids, pupils equal and round b/l, EOMi  ENT:  Normal lips  NEURO:  CN2-12 without focal defecit, normal sensation in hands  PULM:  cta b/l, normal effort  CVS:  rrr -m/r/gl  SKIN:  Warm, dry, no visible lesions nor rashes  MS:  Normal digits/nails,  nml gait and station    A/P:     1. Essential hypertension  Comprehensive metabolic panel   2. Depression screening  Comprehensive metabolic panel   3. Prinzmetal angina  Ambulatory referral to Cardiology    Comprehensive metabolic panel   4. Barrett's esophagus without dysplasia  Comprehensive metabolic panel    Ambulatory referral to Gastroenterology   5. Gastroesophageal reflux disease with esophagitis  Comprehensive metabolic panel    Ambulatory referral to Gastroenterology   6. Hypercholesterolemia  Comprehensive metabolic panel   7. Mild intermittent asthma without complication  Comprehensive metabolic panel   8. Type 2 diabetes mellitus without complication, without long-term current use of insulin  Comprehensive metabolic panel    Microalbumin, Random Urine    Hemoglobin A1C    Diabetic Retinopathy Exam    insulin detemir (LEVEMIR) 100 UNIT/ML injection    insulin lispro (HUMALOG) 100 UNIT/ML injection   9. History of MI (myocardial infarction)  Ambulatory referral to Cardiology    Comprehensive metabolic panel   10. Morbid (severe) obesity due to excess calories  Comprehensive metabolic panel   11. Mixed anxiety depressive disorder  Comprehensive metabolic panel   12. Screening for diabetic retinopathy  Comprehensive metabolic panel    Diabetic Retinopathy Exam   13. Colon cancer screening  Ambulatory referral to Gastroenterology     F/u CPE <3 months    Risk & Benefits of the new medication(s) were explained to the patient (and family) who verbalized understanding & agreed to the treatment plan. Patient (family) encouraged to contact me/clinical staff with any questions/concerns      Raj Janus, MD

## 2016-11-05 ENCOUNTER — Encounter (INDEPENDENT_AMBULATORY_CARE_PROVIDER_SITE_OTHER): Payer: Self-pay | Admitting: Family Medicine

## 2016-11-05 ENCOUNTER — Other Ambulatory Visit: Payer: Self-pay

## 2016-11-06 ENCOUNTER — Other Ambulatory Visit: Payer: Self-pay

## 2016-11-07 ENCOUNTER — Telehealth (INDEPENDENT_AMBULATORY_CARE_PROVIDER_SITE_OTHER): Payer: Self-pay | Admitting: Family Medicine

## 2016-11-07 ENCOUNTER — Encounter (INDEPENDENT_AMBULATORY_CARE_PROVIDER_SITE_OTHER): Payer: Self-pay

## 2016-11-07 ENCOUNTER — Other Ambulatory Visit: Payer: Self-pay

## 2016-11-07 LAB — DIABETIC RETINOPATHY EXAM: Severity: NORMAL

## 2016-11-07 MED ORDER — ESOMEPRAZOLE MAGNESIUM 40 MG PO CPDR
40.0000 mg | DELAYED_RELEASE_CAPSULE | Freq: Every morning | ORAL | 3 refills | Status: AC
Start: 2016-11-07 — End: ?
  Filled 2017-02-13: qty 90, 90d supply, fill #0

## 2016-11-07 MED ORDER — INSULIN GLARGINE 100 UNIT/ML SC SOLN
SUBCUTANEOUS | 0 refills | Status: DC
Start: 2016-11-07 — End: 2016-12-09
  Filled 2016-11-10: qty 360, 90d supply, fill #0
  Filled 2016-11-18: qty 340, 85d supply, fill #0

## 2016-11-07 NOTE — Progress Notes (Signed)
Please call pt to inform them that his eye exam was normal.  They may follow-up as discussed, sooner with any problems or concerns.

## 2016-11-07 NOTE — Telephone Encounter (Signed)
On call: paged.  Pharmacy wanted to verify doses of insulin as they are so high.  Checked with chart.  Correct. Called the patient and verified with him and again correct dosing.  Called the pharmacy back and confirmed dosing.     DR. Arizona Sorn

## 2016-11-08 ENCOUNTER — Encounter (INDEPENDENT_AMBULATORY_CARE_PROVIDER_SITE_OTHER): Payer: Self-pay

## 2016-11-08 NOTE — Progress Notes (Signed)
Please call patient to notify them that I would like to discuss their abnormal laboratory results at a follow-up appointment.  May be via video visit.

## 2016-11-09 ENCOUNTER — Encounter (INDEPENDENT_AMBULATORY_CARE_PROVIDER_SITE_OTHER): Payer: Self-pay | Admitting: Family Medicine

## 2016-11-09 ENCOUNTER — Other Ambulatory Visit: Payer: Self-pay

## 2016-11-10 ENCOUNTER — Encounter (INDEPENDENT_AMBULATORY_CARE_PROVIDER_SITE_OTHER): Payer: Self-pay

## 2016-11-10 ENCOUNTER — Other Ambulatory Visit: Payer: Self-pay

## 2016-11-10 MED ORDER — ESOMEPRAZOLE MAGNESIUM 40 MG PO CPDR
DELAYED_RELEASE_CAPSULE | ORAL | 3 refills | Status: DC
Start: 2016-11-10 — End: 2016-12-09
  Filled 2016-11-10: qty 90, 90d supply, fill #0

## 2016-11-10 MED ORDER — INSULIN LISPRO 100 UNIT/ML SC SOLN
SUBCUTANEOUS | 0 refills | Status: DC
Start: 2016-11-10 — End: 2017-01-25
  Filled 2016-11-10: qty 170, 90d supply, fill #0
  Filled 2017-01-24: qty 170, 90d supply, fill #1
  Filled 2017-01-25: qty 20, 11d supply, fill #1

## 2016-11-11 ENCOUNTER — Other Ambulatory Visit: Payer: Self-pay

## 2016-11-17 ENCOUNTER — Encounter (INDEPENDENT_AMBULATORY_CARE_PROVIDER_SITE_OTHER): Payer: Self-pay | Admitting: Family Medicine

## 2016-11-17 ENCOUNTER — Ambulatory Visit (INDEPENDENT_AMBULATORY_CARE_PROVIDER_SITE_OTHER): Payer: Commercial Managed Care - HMO | Admitting: Family Medicine

## 2016-11-17 VITALS — BP 125/78 | HR 108 | Temp 97.8°F | Ht 67.72 in | Wt 370.0 lb

## 2016-11-17 DIAGNOSIS — J019 Acute sinusitis, unspecified: Secondary | ICD-10-CM

## 2016-11-17 DIAGNOSIS — B9689 Other specified bacterial agents as the cause of diseases classified elsewhere: Secondary | ICD-10-CM

## 2016-11-17 MED ORDER — SULFAMETHOXAZOLE-TRIMETHOPRIM 800-160 MG PO TABS
1.0000 | ORAL_TABLET | Freq: Two times a day (BID) | ORAL | 0 refills | Status: AC
Start: 2016-11-17 — End: 2016-11-30
  Filled 2016-11-20: qty 20, 10d supply, fill #0

## 2016-11-17 NOTE — Progress Notes (Signed)
Chief Complaint   Patient presents with   . Cough     dry cough, sinus pain, post nasal drip, fatigue symptoms x 3days          S:    This is a new and worsening problem.  Pt notes 3 days of worsening n/c and facial pressure and congestion.  np cough.  No fevers.  No ear pain but some ear fullness.  Has home humidifier but not using.  Tried tylenol.  Avoids decongestants.  Tylenol helped slightly.      No Known Allergies    Current Outpatient Prescriptions   Medication Sig Dispense Refill   . albuterol (PROAIR HFA) 108 (90 Base) MCG/ACT inhaler Inhale into the lungs.     Marland Kitchen amLODIPine (NORVASC) 5 MG tablet Take 1 tablet (5 mg total) by mouth daily. 90 tablet 1   . aspirin 81 MG tablet Take 81 mg by mouth daily.     Marland Kitchen atorvastatin (LIPITOR) 40 MG tablet Take 1 tablet (40 mg total) by mouth daily for cholesterol. 90 tablet 1   . B-D INS SYR ULTRAFINE 1CC/31G 31G X 5/16" 1 ML Misc      . beclomethasone (QVAR) 80 MCG/ACT inhaler Inhale into the lungs.     Marland Kitchen escitalopram (LEXAPRO) 10 MG tablet Take 1 tablet (10 mg total) by mouth daily. 90 tablet 0   . esomeprazole (NEXIUM) 40 MG capsule Take 1 capsule (40 mg total) by mouth every morning before breakfast. 90 capsule 3   . esomeprazole (NEXIUM) 40 MG capsule Take 1 capsule by mouth every morning before breakfast 90 capsule 3   . glucose blood (ONETOUCH VERIO) test strip Use as directed twice daily to monitor blood glucose 100 each 1   . insulin detemir (LEVEMIR) 100 UNIT/ML injection Inject 200 Units into the skin every 12 (twelve) hours. 360 mL 0   . insulin glargine (LANTUS) 100 UNIT/ML injection Inject 200 units into the skin every 12 (twelve) hours 360 mL 0   . insulin lispro (HUMALOG) 100 UNIT/ML injection Inject 45 Units into the skin 3 (three) times daily before meals. 190 mL 0   . insulin lispro (HUMALOG) 100 UNIT/ML injection Inject 60 units into the skin 3 times daily before meals 190 mL 0   . isosorbide mononitrate (IMDUR) 30 MG 24 hr tablet Take 3 tablets (90  mg total) by mouth daily. 270 tablet 1   . isosorbide mononitrate (IMDUR) 60 MG 24 hr tablet 1 tab 1/2 po every day 135 tablet 0   . lisinopril (PRINIVIL,ZESTRIL) 5 MG tablet Take 1 tablet (5 mg total) by mouth daily. 90 tablet 1   . metFORMIN (GLUCOPHAGE) 850 MG tablet Take 1 tablet (850 mg total) by mouth 3 (three) times daily with meals. 270 tablet 1   . nitroglycerin (NITROSTAT) 0.4 MG SL tablet Place 1 tablet (0.4 mg total) under the tongue every 5 (five) minutes as needed. 90 tablet 0   . ONETOUCH DELICA LANCETS 33G Misc Use as directed twice daily to monitor blood glucose 100 each 1   . sulfamethoxazole-trimethoprim (BACTRIM DS) 800-160 MG per tablet Take 1 tablet by mouth 2 (two) times daily.for 10 days 20 tablet 0     No current facility-administered medications for this visit.        Review of Systems   Constitutional: Positive for malaise/fatigue. Negative for fever.   HENT: Positive for congestion, sinus pain and sore throat. Negative for ear pain and hearing loss.  Respiratory: Positive for cough. Negative for sputum production.    Cardiovascular: Negative for chest pain.       All other systems were reviewed and are negative    O:  BP 125/78   Pulse (!) 108   Temp 97.8 F (36.6 C)   Ht 1.72 m (5' 7.72")   Wt (!) 167.8 kg (370 lb)   BMI 56.72 kg/m , Body mass index is 56.72 kg/m.  Vital signs reviewed  GEN:  NAD, obese  NEURO:  CN2-12 without focal defecit, nml gait and station  HEENT:  Mild n/c, moderate pnd, nml TMs  PULM:  Cta b/l  CVS:  rrr -m/r/g  SKIN:  Warm, dry  EXT:  No edema, +2 radial pulse b/l    A/P:     1. Acute bacterial rhinosinusitis  sulfamethoxazole-trimethoprim (BACTRIM DS) 800-160 MG per tablet  Rest  hydration  Home humidification         Risk & Benefits of the new medication(s) were explained to the patient (and family) who verbalized understanding & agreed to the treatment plan. Patient (family) encouraged to contact me/clinical staff with any  questions/concerns    Raj Janus, MD

## 2016-11-18 ENCOUNTER — Other Ambulatory Visit: Payer: Self-pay

## 2016-11-20 ENCOUNTER — Other Ambulatory Visit: Payer: Self-pay

## 2016-11-22 ENCOUNTER — Other Ambulatory Visit (INDEPENDENT_AMBULATORY_CARE_PROVIDER_SITE_OTHER): Payer: Self-pay | Admitting: Family Medicine

## 2016-11-22 ENCOUNTER — Ambulatory Visit (INDEPENDENT_AMBULATORY_CARE_PROVIDER_SITE_OTHER): Payer: Commercial Managed Care - HMO

## 2016-11-22 DIAGNOSIS — Z111 Encounter for screening for respiratory tuberculosis: Secondary | ICD-10-CM

## 2016-11-22 NOTE — Progress Notes (Signed)
PPD Placement note  Brent Palmer, 51 y.o. male is here today for placement of PPD test  Pt taken PPD test before: yes  Verified in allergy area and with patient that they are not allergic to the products PPD is made of (Phenol or Tween). Yes  Is patient taking any oral or IV steroid medication now or have they taken it in the last month? no  Has the patient ever received the BCG vaccine?: no  Has the patient been in recent contact with anyone known or suspected of having active TB disease?: no       Date of exposure (if applicable): n/a       Name of person they were exposed to (if applicable): n/a  Patient's Country of origin?: [place]  O: Alert and oriented in NAD.  P:  PPD placed on 11/22/2016.  Patient advised to return for reading within 48-72 hours.

## 2016-11-25 ENCOUNTER — Ambulatory Visit (INDEPENDENT_AMBULATORY_CARE_PROVIDER_SITE_OTHER): Payer: Commercial Managed Care - HMO

## 2016-11-25 DIAGNOSIS — Z111 Encounter for screening for respiratory tuberculosis: Secondary | ICD-10-CM

## 2016-11-25 LAB — TB SKIN TEST
Induration: 0
TB Skin Test: NEGATIVE mm

## 2016-11-25 NOTE — Progress Notes (Signed)
PPD Reading Note  PPD read and results entered in [employer].  Result: 0 mm induration.  Interpretation: negative  If test not read within 48-72 hours of initial placement, patient advised to repeat in other arm 1-3 weeks after this test.  Allergic reaction: no

## 2016-11-26 ENCOUNTER — Other Ambulatory Visit: Payer: Self-pay

## 2016-12-01 ENCOUNTER — Other Ambulatory Visit: Payer: Self-pay

## 2016-12-03 ENCOUNTER — Encounter (INDEPENDENT_AMBULATORY_CARE_PROVIDER_SITE_OTHER): Payer: Self-pay | Admitting: Family Medicine

## 2016-12-03 ENCOUNTER — Other Ambulatory Visit: Payer: Self-pay

## 2016-12-05 ENCOUNTER — Other Ambulatory Visit: Payer: Self-pay

## 2016-12-06 ENCOUNTER — Other Ambulatory Visit: Payer: Self-pay

## 2016-12-07 ENCOUNTER — Other Ambulatory Visit: Payer: Self-pay

## 2016-12-07 MED ORDER — ATORVASTATIN CALCIUM 40 MG PO TABS
40.0000 mg | ORAL_TABLET | Freq: Every day | ORAL | 3 refills | Status: AC
Start: 2016-12-07 — End: ?
  Filled 2016-12-07: qty 90, 90d supply, fill #0

## 2016-12-09 ENCOUNTER — Other Ambulatory Visit: Payer: Self-pay

## 2016-12-09 ENCOUNTER — Encounter (INDEPENDENT_AMBULATORY_CARE_PROVIDER_SITE_OTHER): Payer: Self-pay | Admitting: Family Medicine

## 2016-12-09 ENCOUNTER — Ambulatory Visit (INDEPENDENT_AMBULATORY_CARE_PROVIDER_SITE_OTHER): Payer: Commercial Managed Care - HMO | Admitting: Family Medicine

## 2016-12-09 VITALS — BP 131/81 | HR 114 | Temp 97.7°F | Ht 68.0 in | Wt 370.0 lb

## 2016-12-09 DIAGNOSIS — E7800 Pure hypercholesterolemia, unspecified: Secondary | ICD-10-CM

## 2016-12-09 DIAGNOSIS — Z1211 Encounter for screening for malignant neoplasm of colon: Secondary | ICD-10-CM

## 2016-12-09 DIAGNOSIS — I201 Angina pectoris with documented spasm: Secondary | ICD-10-CM

## 2016-12-09 DIAGNOSIS — F418 Other specified anxiety disorders: Secondary | ICD-10-CM

## 2016-12-09 DIAGNOSIS — E119 Type 2 diabetes mellitus without complications: Secondary | ICD-10-CM

## 2016-12-09 DIAGNOSIS — K2100 Gastro-esophageal reflux disease with esophagitis, without bleeding: Secondary | ICD-10-CM

## 2016-12-09 DIAGNOSIS — J4520 Mild intermittent asthma, uncomplicated: Secondary | ICD-10-CM

## 2016-12-09 DIAGNOSIS — I252 Old myocardial infarction: Secondary | ICD-10-CM

## 2016-12-09 DIAGNOSIS — Z0000 Encounter for general adult medical examination without abnormal findings: Secondary | ICD-10-CM

## 2016-12-09 DIAGNOSIS — K2270 Barrett's esophagus without dysplasia: Secondary | ICD-10-CM

## 2016-12-09 MED ORDER — ONETOUCH DELICA LANCETS 33G MISC
3 refills | Status: AC
Start: 2016-12-09 — End: ?
  Filled 2016-12-09: fill #0
  Filled 2016-12-15: qty 200, 90d supply, fill #0

## 2016-12-09 MED ORDER — GLUCOSE BLOOD VI STRP
ORAL_STRIP | 3 refills | Status: AC
Start: 2016-12-09 — End: ?
  Filled 2016-12-09: fill #0
  Filled 2016-12-15: qty 200, 90d supply, fill #0

## 2016-12-09 MED ORDER — INSULIN SYRINGE-NEEDLE U-100 31G X 5/16" 1 ML MISC
3 refills | Status: AC
Start: 2016-12-09 — End: ?
  Filled 2016-12-09: qty 700, fill #0
  Filled 2016-12-15: qty 630, 90d supply, fill #0

## 2016-12-09 NOTE — Progress Notes (Signed)
Subjective:     Patient is a 51 y.o. male who presents for their routine comprehensive medical evaluation.  Patient concerns include:      No problems updated.  .      No Known Allergies   Patient Active Problem List    Diagnosis Date Noted   . Type 2 diabetes mellitus without complication, without long-term current use of insulin 11/04/2016   . History of MI (myocardial infarction) 11/04/2016   . Mixed anxiety depressive disorder 11/04/2016   . Gastroesophageal reflux disease with esophagitis 11/04/2016   . Morbid (severe) obesity due to excess calories 08/06/2014   . Prinzmetal angina    . Barrett esophagus 04/10/2013   . Mild intermittent asthma without complication 04/10/2013     Past Medical History:   Diagnosis Date   . Anxiety    . Asthma without status asthmaticus    . Barrett esophagus    . Depression    . Diabetes mellitus    . GERD (gastroesophageal reflux disease)    . Heart attack 10/02/2011   . Morbid obesity    . Prinzmetal angina    . Seasonal allergies    . Ulcer        Last Colonoscopy:  never   Consultants:  none   Last Dental:  2015   Last Optometry:  10/2016  Past Surgical History:   Procedure Laterality Date   . CARDIAC CATHETERIZATION  2002, 2012   . CHOLECYSTECTOMY     . CLEFT PALATE REPAIR         Immunizations:   Immunization History   Administered Date(s) Administered   . Influenza (Im) Preservative Free 07/27/2016   . Influenza quadrivalent (IM) 3 Yrs & greater 08/20/2013   . PPD Test 04/07/2014, 11/22/2016   . Pneumococcal 23 valent 03/23/2015   . Tdap 04/12/2016     Immunization status including dT, annual influenza, and herpes zoster: up to date and documented.      Social History   Substance Use Topics   . Smoking status: Never Smoker   . Smokeless tobacco: Never Used   . Alcohol use No      Marrital status:    married    History   Sexual Activity   . Sexual activity: No     Religious:  prefer not to say  Occupation:  Social work  Seatbelts:  consistently  Habits:  Average daily  coffee:  2 cups per  day, soda:  1 drinks per  day, water: 2 liters per day and excersize:  2 times per week  Sleep:  Averages 7 hrs per night and feels well rested     Family History   Problem Relation Age of Onset   . Dementia Maternal Grandmother    . Heart disease Maternal Grandfather         Review of Systems  Pertinent items are noted in HPI.  All other system reviewed and are negative.    Objective:     Vitals:    12/09/16 1506   BP: 131/81   Pulse: (!) 114   Temp: 97.7 F (36.5 C)     Body mass index is 56.26 kg/m.    Vitals reviewed  General Appearance:    Alert, cooperative, no distress, appears stated age   Head:    Normocephalic, without obvious abnormality, atraumatic   Eyes:    PERRL, conjunctiva/corneas clear, EOM's intact, fundi     benign, both eyes   Ears:  Normal TM's and external ear canals, both ears   Nose:   Nares normal, septum midline, mucosa normal, no drainage     or sinus tenderness   Throat:   Lips, mucosa, and tongue normal; teeth and gums normal   Neck:   Supple, symmetrical, trachea midline, no adenopathy;     thyroid:  no enlargement/tenderness/nodules; no carotid    bruit or JVD   Back:     Symmetric, no curvature, ROM normal, no CVA tenderness   Lungs:     Clear to auscultation bilaterally, respirations unlabored   Chest Wall:    No tenderness or deformity    Heart:    Regular rate and rhythm, S1 and S2 normal, no murmur, rub    or gallop       Abdomen:     Soft, non-tender, bowel sounds active all four quadrants,     no masses, no organomegaly   Genitalia:    Normal penis and testes. No palpable hernia.  No testicular nor scrotal masses, no discharge nor tenderness.   Rectal:    Normal tone, no masses or tenderness   Extremities:   Extremities normal, atraumatic, no cyanosis or edema   Pulses:   2+ and symmetric all extremities   Skin:   Skin color, texture, turgor normal, no rashes or lesions   Lymph nodes:   Cervical, supraclavicular, and axillary nodes normal   Neurologic:    CNII-XII intact, normal strength, sensation and reflexes     throughout     Foot Exam: capillary refill of toes intact bilaterally, dorsalis pedis and posterior tibial pulses intact bilaterally, monofilament testing of plantar aspect of first toe and metatarsal joints (10g monofilament) intact bilaterally, no evidence of dependent rubor bilaterally and no fissures, maceration, ulceration bilaterally      ECG:  Not indicated       Assessment:       ICD-10-CM    1. Preventative health care Z00.00    2. Barrett's esophagus without dysplasia K22.70    3. Gastroesophageal reflux disease with esophagitis K21.0    4. History of MI (myocardial infarction) I25.2    5. Hypercholesterolemia E78.00    6. Mild intermittent asthma without complication J45.20    7. Type 2 diabetes mellitus without complication, without long-term current use of insulin E11.9 Insulin Syringe-Needle U-100 (B-D INS SYR ULTRAFINE 1CC/31G) 31G X 5/16" 1 ML Misc     glucose blood (ONETOUCH VERIO) test strip     ONETOUCH DELICA LANCETS 33G Misc   8. Prinzmetal angina I20.1    9. Morbid (severe) obesity due to excess calories E66.01    10. Mixed anxiety depressive disorder F41.8    11. Colon cancer screening Z12.11 Ambulatory referral to Gastroenterology         Plan:     Orders Placed This Encounter   Procedures   . Ambulatory referral to Gastroenterology         Discussed the patient's BMI with him.      HIgh BMI Follow Up   Encouragement to Exercise    MyChart access encouraged    Follow-up per routine as discussed and as needed pending lab evaluation  Low fat and low cholesterol diet with limited sodium  Adequate hydration of approximately 2 liters of water daily  Avoid second-hand smoke exposure  Limit alcohol intake to less than 2 oz daily  Adequate exercise to include 150 minutes of aerobic activity weekly  Consistent multivitamin use  Consistent seatbelt use  Semiannual dental evaluations  Eye examination every 1-2 years  Reviewed testicular  self-examinations    Risk & Benefits of any new medication(s) were explained to the patient (and family) who verbalized understanding & agreed to the treatment plan. Patient (family) encouraged to contact me/clinical staff with any questions/concerns    Raj Janus, MD

## 2016-12-09 NOTE — Progress Notes (Signed)
1. Have you self referred yourself since we last saw you?    Refer to care team   Or  Add specialists:    No

## 2016-12-13 ENCOUNTER — Other Ambulatory Visit: Payer: Self-pay

## 2016-12-14 ENCOUNTER — Other Ambulatory Visit: Payer: Self-pay

## 2016-12-14 MED ORDER — AMLODIPINE BESYLATE 5 MG PO TABS
5.0000 mg | ORAL_TABLET | Freq: Every day | ORAL | 0 refills | Status: AC
Start: 2016-12-14 — End: ?
  Filled 2016-12-14: qty 90, 90d supply, fill #0

## 2016-12-15 ENCOUNTER — Telehealth (INDEPENDENT_AMBULATORY_CARE_PROVIDER_SITE_OTHER): Payer: Self-pay | Admitting: Family Medicine

## 2016-12-15 MED ORDER — AMLODIPINE BESYLATE 5 MG PO TABS
5.0000 mg | ORAL_TABLET | Freq: Every day | ORAL | 0 refills | Status: DC
Start: 2016-12-15 — End: 2016-12-20

## 2016-12-15 NOTE — Telephone Encounter (Signed)
Called as needs refills for Norvasc. Agreed to fax refills   Prescription was called in 12/14/16

## 2016-12-16 ENCOUNTER — Other Ambulatory Visit: Payer: Self-pay

## 2016-12-19 ENCOUNTER — Other Ambulatory Visit: Payer: Self-pay

## 2016-12-20 ENCOUNTER — Encounter (INDEPENDENT_AMBULATORY_CARE_PROVIDER_SITE_OTHER): Payer: Self-pay | Admitting: Family Medicine

## 2016-12-20 ENCOUNTER — Ambulatory Visit (INDEPENDENT_AMBULATORY_CARE_PROVIDER_SITE_OTHER): Payer: Commercial Managed Care - HMO | Admitting: Family Medicine

## 2016-12-20 ENCOUNTER — Other Ambulatory Visit: Payer: Self-pay

## 2016-12-20 VITALS — BP 138/75 | HR 109 | Temp 98.0°F | Ht 68.0 in | Wt 365.8 lb

## 2016-12-20 DIAGNOSIS — S39012A Strain of muscle, fascia and tendon of lower back, initial encounter: Secondary | ICD-10-CM

## 2016-12-20 MED ORDER — CYCLOBENZAPRINE HCL 10 MG PO TABS
10.0000 mg | ORAL_TABLET | Freq: Three times a day (TID) | ORAL | 0 refills | Status: AC | PRN
Start: 2016-12-20 — End: 2017-12-20
  Filled 2016-12-20: qty 30, 10d supply, fill #0

## 2016-12-20 NOTE — Progress Notes (Signed)
Nursing Documentation:  Limb alert status: Patient asked and denied any limb restrictions for blood pressure/blood draws.  Has the patient seen any other providers since their last visit: no  The patient is due for colonoscopy and [facility].

## 2016-12-20 NOTE — Progress Notes (Signed)
Chief Complaint   Patient presents with   . Back Pain     onset 1 day         S:    This is a new problem and worsening:  Pt notes while sitting in bed watching TV yesterday noted sever back spasms.  Has kept him from sleep.  This is second episode with first about 8 years ago.  Dr. Danelle Earthly has given some relafin which has also been used for a knee issue some time ago.      No Known Allergies    Current Outpatient Prescriptions   Medication Sig Dispense Refill   . albuterol (PROAIR HFA) 108 (90 Base) MCG/ACT inhaler Inhale into the lungs.     Marland Kitchen amLODIPine (NORVASC) 5 MG tablet Take 1 tablet (5 mg total) by mouth daily. 90 tablet 0   . amLODIPine (NORVASC) 5 MG tablet Take 1 tablet (5 mg total) by mouth daily. 90 tablet 0   . aspirin 81 MG tablet Take 81 mg by mouth daily.     Marland Kitchen atorvastatin (LIPITOR) 40 MG tablet Take 1 tablet (40 mg total) by mouth daily for cholesterol. 90 tablet 3   . beclomethasone (QVAR) 80 MCG/ACT inhaler Inhale into the lungs.     Marland Kitchen escitalopram (LEXAPRO) 10 MG tablet Take 1 tablet (10 mg total) by mouth daily. 90 tablet 0   . esomeprazole (NEXIUM) 40 MG capsule Take 1 capsule (40 mg total) by mouth every morning before breakfast. 90 capsule 3   . glucose blood (ONETOUCH VERIO) test strip Use as directed twice daily to monitor blood glucose 200 each 3   . insulin detemir (LEVEMIR) 100 UNIT/ML injection Inject 200 Units into the skin every 12 (twelve) hours. 360 mL 0   . insulin lispro (HUMALOG) 100 UNIT/ML injection Inject 60 units into the skin 3 times daily before meals 190 mL 0   . Insulin Syringe-Needle U-100 (B-D INS SYR ULTRAFINE 1CC/31G) 31G X 5/16" 1 ML Misc Used 7 times daily for insulin injection 700 each 3   . isosorbide mononitrate (IMDUR) 30 MG 24 hr tablet Take 3 tablets (90 mg total) by mouth daily. 270 tablet 1   . isosorbide mononitrate (IMDUR) 60 MG 24 hr tablet 1 tab 1/2 po every day 135 tablet 0   . lisinopril (PRINIVIL,ZESTRIL) 5 MG tablet Take 1 tablet (5 mg total) by  mouth daily. 90 tablet 1   . metFORMIN (GLUCOPHAGE) 850 MG tablet Take 1 tablet (850 mg total) by mouth 3 (three) times daily with meals. 270 tablet 1   . nitroglycerin (NITROSTAT) 0.4 MG SL tablet Place 1 tablet (0.4 mg total) under the tongue every 5 (five) minutes as needed. 90 tablet 0   . ONETOUCH DELICA LANCETS 33G Misc Use as directed twice daily to monitor blood glucose 200 each 3     No current facility-administered medications for this visit.        Review of Systems   Constitutional: Negative for fever and weight loss.   HENT: Negative for sore throat.    Eyes: Negative for blurred vision.   Respiratory: Negative for cough and shortness of breath.    Cardiovascular: Negative for chest pain and palpitations.   Gastrointestinal: Negative for nausea and vomiting.   Musculoskeletal: Positive for back pain. Negative for falls, joint pain and myalgias.   Skin: Negative for rash.   Neurological: Negative for tingling, focal weakness and headaches.       All other systems  were reviewed and are negative    O:  BP 138/75 (BP Site: Right arm, Patient Position: Sitting)   Pulse (!) 109   Temp 98 F (36.7 C) (Oral)   Ht 1.727 m (5\' 8" )   Wt (!) 165.9 kg (365 lb 12.8 oz)   BMI 55.62 kg/m , Body mass index is 55.62 kg/m.  Vital signs reviewed  GEN:  NAD, obese  NEURO:  CN2-12 without focal defecit, nml gait and station  CVS:  rrr -m/r/g  ABD:  Soft, NT, ND, +BS  BACK:  Left lower thoracic back with mild muscle spasms and moderate tenderness.  No rash.  No bony abnormality.  Right with more mild sx.    SKIN:  Warm, dry  EXT:  No edema, +2 radial pulse b/l    A/P:     1. Back strain, initial encounter  cyclobenzaprine (FLEXERIL) 10 MG tablet     Ice topically  Wt loss  F/u prn    Risk & Benefits of the new medication(s) were explained to the patient (and family) who verbalized understanding & agreed to the treatment plan. Patient (family) encouraged to contact me/clinical staff with any questions/concerns    Raj Janus, MD

## 2016-12-21 ENCOUNTER — Encounter (INDEPENDENT_AMBULATORY_CARE_PROVIDER_SITE_OTHER): Payer: Self-pay | Admitting: Family Medicine

## 2016-12-26 ENCOUNTER — Other Ambulatory Visit: Payer: Self-pay

## 2016-12-27 ENCOUNTER — Telehealth (INDEPENDENT_AMBULATORY_CARE_PROVIDER_SITE_OTHER): Payer: Self-pay | Admitting: Family Medicine

## 2016-12-27 NOTE — Telephone Encounter (Signed)
I spoke with patient and maybe a bad bottle but better feeling now and better control.

## 2016-12-27 NOTE — Telephone Encounter (Signed)
Patient left message stating BS has been elevated.  Yesterday morning when he woke up BS reading was 295, he administered 100 units of humalog and rechecked 1.5 hours after reading was at 291.  Yesterday he was feeling nauseous and sick.  Please advise.

## 2016-12-29 ENCOUNTER — Other Ambulatory Visit: Payer: Self-pay

## 2017-01-02 ENCOUNTER — Encounter (INDEPENDENT_AMBULATORY_CARE_PROVIDER_SITE_OTHER): Payer: Self-pay | Admitting: Family Medicine

## 2017-01-02 ENCOUNTER — Other Ambulatory Visit: Payer: Self-pay

## 2017-01-02 ENCOUNTER — Telehealth (INDEPENDENT_AMBULATORY_CARE_PROVIDER_SITE_OTHER): Payer: Self-pay | Admitting: Family Medicine

## 2017-01-02 NOTE — Telephone Encounter (Signed)
Patient called requesting medication change, patient stated he is taking 600 units of levemir and has BS of 250, stated he has 12 g of carbs.  He is requesting lantus 400 units, this requires a PA and the patient states if you ask for tier exceptions he could save $50 and medication will be a tier 2. Please advise.

## 2017-01-03 ENCOUNTER — Other Ambulatory Visit: Payer: Self-pay

## 2017-01-04 ENCOUNTER — Telehealth (INDEPENDENT_AMBULATORY_CARE_PROVIDER_SITE_OTHER): Payer: Self-pay | Admitting: Family Medicine

## 2017-01-04 ENCOUNTER — Other Ambulatory Visit: Payer: Self-pay

## 2017-01-04 ENCOUNTER — Other Ambulatory Visit (INDEPENDENT_AMBULATORY_CARE_PROVIDER_SITE_OTHER): Payer: Self-pay | Admitting: Family Medicine

## 2017-01-04 MED ORDER — INSULIN GLARGINE 100 UNIT/ML SC SOLN
SUBCUTANEOUS | 3 refills | Status: AC
Start: 2017-01-04 — End: 2018-01-04
  Filled 2017-01-04: qty 30, 7d supply, fill #0
  Filled 2017-01-06: qty 360, 90d supply, fill #1

## 2017-01-04 NOTE — Telephone Encounter (Signed)
After hours call:  Pt called requesting pre authorization for Insulin.   When answering service trying to connect the caller, line busy multiple times, like phone is off the hook.    I tried phone # in the chart and spoke w/him  He has been working on getting approval for his Lantus insulin, pharmacy did not give it to him.  He talked to Dr Crista Curb this AM and explained every thing. He does not want to use Levemir because it is not working and he has local reaction at site of injection.  He is concerned about his elevated sugars.  He also expressed his frustration with the pre- approval,  "I have been trying for 5 days"  I expressed my understanding and explained to him that pre-approval is time consuming, will pass the message to Dr Crista Curb.

## 2017-01-05 ENCOUNTER — Other Ambulatory Visit: Payer: Self-pay

## 2017-01-06 ENCOUNTER — Other Ambulatory Visit: Payer: Self-pay

## 2017-01-09 ENCOUNTER — Other Ambulatory Visit: Payer: Self-pay

## 2017-01-10 ENCOUNTER — Other Ambulatory Visit: Payer: Self-pay

## 2017-01-11 ENCOUNTER — Other Ambulatory Visit: Payer: Self-pay

## 2017-01-14 ENCOUNTER — Encounter (INDEPENDENT_AMBULATORY_CARE_PROVIDER_SITE_OTHER): Payer: Self-pay | Admitting: Family Medicine

## 2017-01-16 ENCOUNTER — Other Ambulatory Visit (INDEPENDENT_AMBULATORY_CARE_PROVIDER_SITE_OTHER): Payer: Self-pay | Admitting: Family Medicine

## 2017-01-16 ENCOUNTER — Other Ambulatory Visit: Payer: Self-pay

## 2017-01-16 DIAGNOSIS — F330 Major depressive disorder, recurrent, mild: Secondary | ICD-10-CM

## 2017-01-16 DIAGNOSIS — I201 Angina pectoris with documented spasm: Secondary | ICD-10-CM

## 2017-01-16 MED ORDER — ESCITALOPRAM OXALATE 10 MG PO TABS
10.0000 mg | ORAL_TABLET | Freq: Every day | ORAL | 1 refills | Status: AC
Start: 2017-01-16 — End: ?
  Filled 2017-01-16: qty 90, 90d supply, fill #0

## 2017-01-16 MED ORDER — ISOSORBIDE MONONITRATE ER 30 MG PO TB24
90.0000 mg | ORAL_TABLET | Freq: Every day | ORAL | 1 refills | Status: AC
Start: 2017-01-16 — End: ?
  Filled 2017-01-16: qty 270, 90d supply, fill #0

## 2017-01-21 ENCOUNTER — Other Ambulatory Visit: Payer: Self-pay

## 2017-01-22 ENCOUNTER — Other Ambulatory Visit: Payer: Self-pay

## 2017-01-24 ENCOUNTER — Other Ambulatory Visit: Payer: Self-pay

## 2017-01-25 ENCOUNTER — Other Ambulatory Visit (INDEPENDENT_AMBULATORY_CARE_PROVIDER_SITE_OTHER): Payer: Self-pay | Admitting: Family Medicine

## 2017-01-25 ENCOUNTER — Other Ambulatory Visit: Payer: Self-pay

## 2017-01-25 MED ORDER — INSULIN LISPRO 100 UNIT/ML SC SOLN
SUBCUTANEOUS | 0 refills | Status: AC
Start: 2017-01-25 — End: ?
  Filled 2017-01-30: qty 170, 90d supply, fill #0

## 2017-01-30 ENCOUNTER — Other Ambulatory Visit: Payer: Self-pay

## 2017-01-31 ENCOUNTER — Other Ambulatory Visit: Payer: Self-pay

## 2017-02-02 ENCOUNTER — Encounter (INDEPENDENT_AMBULATORY_CARE_PROVIDER_SITE_OTHER): Payer: Self-pay

## 2017-02-13 ENCOUNTER — Other Ambulatory Visit: Payer: Self-pay

## 2017-02-13 ENCOUNTER — Other Ambulatory Visit (INDEPENDENT_AMBULATORY_CARE_PROVIDER_SITE_OTHER): Payer: Self-pay | Admitting: Family Medicine

## 2017-02-14 ENCOUNTER — Other Ambulatory Visit: Payer: Self-pay

## 2017-02-17 ENCOUNTER — Other Ambulatory Visit: Payer: Self-pay

## 2017-02-19 ENCOUNTER — Other Ambulatory Visit: Payer: Self-pay

## 2017-04-10 ENCOUNTER — Other Ambulatory Visit: Payer: Self-pay

## 2017-04-12 ENCOUNTER — Encounter: Payer: Self-pay | Admitting: Physician Assistant

## 2017-04-19 NOTE — Progress Notes (Signed)
Cardiology Office Note   Date:  04/19/2017   ID:  Henry Vasquez, DOB 15-Sep-1966, MRN [id number]  PCP:  No primary care provider on file.  Cardiologist:   Charlton HawsPeter Nishan, MD     No chief complaint on file.     History of Present Illness: Henry Vasquez is a 51 y.o. male who presents for evaluation of CAD and history of MI in 2012. [facility]  CRF include IDDM, HTN elevated lipids History of GERD and hiatal hernia Notes in CareEveryWhere indicated prinzmetal angina MI when he missed his meds for 2 weeks Had cardiac cath in 2002 and 2012 First had SSCP in 2008 cath has shown no epicardial CAD only spasm 2012 had SSCP after not taking amlodipine and imdur cath at that time showed no spasm or CAD  [place]oved back to [place] about 2 months ago Reviewed office notes Dr Barbaraann Barthelankins. She reported some Atypical pains left costosternal margin reproducible with palpation   Baseline lipids not bad and feels worse with muscle pains on statin so stopped   Past Medical History:  Diagnosis Date  . Anxiety   . Asthma   . Barrett esophagus   . Depression   . Diabetes mellitus without complication (HCC)   . Heart attack (HCC) 10/2011  . Hiatal hernia   . History of Prinzmetal angina     Past Surgical History:  Procedure Laterality Date  . CHOLECYSTECTOMY    . CLEFT LIP REPAIR    . CLEFT PLATE REPAIR    . TONSILLECTOMY       Current Outpatient Prescriptions  Medication Sig Dispense Refill  . albuterol (PROVENTIL HFA;VENTOLIN HFA) 108 (90 Base) MCG/ACT inhaler Inhale 2 puffs into the lungs every 6 (six) hours as needed for wheezing or shortness of breath.    Marland Kitchen. amLODipine (NORVASC) 5 MG tablet Take 5 mg by mouth daily.    Marland Kitchen. aspirin EC 81 MG tablet Take 81 mg by mouth daily.    . beclomethasone (QVAR) 80 MCG/ACT inhaler Inhale 1 puff into the lungs 2 (two) times daily.    Marland Kitchen. escitalopram (LEXAPRO) 10 MG tablet Take 10 mg by mouth daily.    Marland Kitchen. esomeprazole (NEXIUM)  40 MG capsule Take 40 mg by mouth daily at 12 noon.    . insulin glargine (LANTUS) 100 UNIT/ML injection Inject 200 Units into the skin 2 (two) times daily.    . insulin lispro (HUMALOG) 100 UNIT/ML injection Inject 30 Units into the skin 3 (three) times daily before meals.    . isosorbide mononitrate (IMDUR) 30 MG 24 hr tablet Take 90 mg by mouth daily. TAKE THREE TABLETS BY MOUTH  IN THE AM    . lisinopril (PRINIVIL,ZESTRIL) 5 MG tablet Take 5 mg by mouth daily.    . metFORMIN (GLUCOPHAGE) 850 MG tablet Take 850 mg by mouth 3 (three) times daily.    . nitroGLYCERIN (NITROSTAT) 0.4 MG SL tablet Place 0.4 mg under the tongue every 5 (five) minutes as needed for chest pain.     No current facility-administered medications for this visit.     Allergies:   Patient has no known allergies.    Social History:  The patient  reports that he has never smoked. He has never used smokeless tobacco. He reports that he does not use drugs.   Family History:  The patient's family history includes Bipolar disorder in his sister; Drug abuse in his sister; Other in his father and mother.  ROS:  Please see the history of present illness.   Otherwise, review of systems are positive for none.   All other systems are reviewed and negative.    PHYSICAL EXAM: VS:  There were no vitals taken for this visit. , BMI There is no height or weight on file to calculate BMI. Affect appropriate Morbidly obese male  HEENT: normal Neck supple with no adenopathy JVP normal no bruits no thyromegaly Lungs clear with no wheezing and good diaphragmatic motion Heart:  S1/S2 SEM murmur, no rub, gallop or click PMI normal Abdomen: benighn, BS positve, no tenderness, no AAA no bruit.  No HSM or HJR Distal pulses intact with no bruits No edema Neuro non-focal Skin warm and dry No muscular weakness    EKG:  NSR lateral T wave changes 06/01/16    Recent Labs: No results found for requested labs within last 8760 hours.      Lipid Panel    Component Value Date/Time   CHOL  09/07/2007 0335    132        ATP III CLASSIFICATION:  <200     mg/dL   Desirable  161-096  mg/dL   Borderline High  >=045    mg/dL   High   TRIG 409 81/19/1478 0335   HDL 31 (L) 09/07/2007 0335   CHOLHDL 4.3 09/07/2007 0335   VLDL 26 09/07/2007 0335   LDLCALC  09/07/2007 0335    75        Total Cholesterol/HDL:CHD Risk Coronary Heart Disease Risk Table                     Men   Women  1/2 Average Risk   3.4   3.3      Wt Readings from Last 3 Encounters:  No data found for Wt      Other studies Reviewed: Additional studies/ records that were reviewed today include: Took over 35 minutes to review care everywhere records from [place] in [place] only able to locate one cardiology office note from 05/15/13  Labs ECG;s notes regarding previous heart caths .    ASSESSMENT AND PLAN:  1.  Prinzmetal Angina history not clear but continue calcium blocker and imdur new SL nitro called in 2. HTN  Well controlled.  Continue current medications and low sodium Dash type diet.   3. IDDM  Discussed low carb diet.  Target hemoglobin A1c is 6.5 or less.  Continue current medications. 4. Obesity  /fu Dr [facility] he has tried to be active and eat right not working  5. GERD related to obesity and diet continue nexium  6. OSA last testing 9 years ago did not fall asleep needs referral per Dr Luciana Axe  7. Cholesterol agree with previously clean cath and LDL under 100 no need for statin especially if He feels worse on them 8. Murmur:  AS murmur on exam f/u echo    Current medicines are reviewed at length with the patient today.  The patient does not have concerns regarding medicines.  The following changes have been made:  no change  Labs/ tests ordered today include: Echo  No orders of the defined types were placed in this encounter.    Disposition:   FU with me in a year      Signed, Charlton Haws, MD   04/19/2017 9:20 PM    [facility] [address] [place], [place], [place]  [address] Phone: [phone];  Fax: [phone]  

## 2017-04-20 ENCOUNTER — Encounter: Payer: Self-pay | Admitting: Cardiovascular Disease

## 2017-04-20 ENCOUNTER — Encounter (INDEPENDENT_AMBULATORY_CARE_PROVIDER_SITE_OTHER): Payer: Self-pay

## 2017-04-20 ENCOUNTER — Ambulatory Visit (INDEPENDENT_AMBULATORY_CARE_PROVIDER_SITE_OTHER): Payer: Managed Care, Other (non HMO) | Admitting: Cardiovascular Disease

## 2017-04-20 VITALS — BP 120/60 | HR 89 | Ht 68.0 in | Wt 376.0 lb

## 2017-04-20 DIAGNOSIS — R011 Cardiac murmur, unspecified: Secondary | ICD-10-CM

## 2017-04-20 MED ORDER — NITROGLYCERIN 0.4 MG SL SUBL: 0.4000 mg | SUBLINGUAL_TABLET | SUBLINGUAL | 3 refills | Status: AC | PRN

## 2017-04-20 NOTE — Patient Instructions (Addendum)
Medication Instructions:  Your physician has recommended you make the following change in your medication:  1-Nitroglycerin 0.4 mg   Take 1 NTG, under your tongue, while sitting. If no relief of pain may repeat NTG, one tab every 5 minutes up to 3 tablets total over 15 minutes. If no relief CALL 911. If you have dizziness/lightheadness while taking NTG, stop taking and call 911.  Labwork: NONE  Testing/Procedures: Your physician has requested that you have an echocardiogram. Echocardiography is a painless test that uses sound waves to create images of your heart. It provides your doctor with information about the size and shape of your heart and how well your heart's chambers and valves are working. This procedure takes approximately one hour. There are no restrictions for this procedure.  Follow-Up: Your physician wants you to follow-up in: 12 months with Dr. Eden EmmsNishan. You will receive a reminder letter in the mail two months in advance. If you don't receive a letter, please call our office to schedule the follow-up appointment.   If you need a refill on your cardiac medications before your next appointment, please call your pharmacy.

## 2017-04-24 ENCOUNTER — Encounter: Payer: Self-pay | Admitting: Gastroenterology

## 2017-04-27 ENCOUNTER — Ambulatory Visit: Payer: Self-pay | Admitting: Physician Assistant

## 2017-05-04 ENCOUNTER — Ambulatory Visit (HOSPITAL_COMMUNITY): Payer: Managed Care, Other (non HMO) | Attending: Cardiology

## 2017-05-04 ENCOUNTER — Other Ambulatory Visit: Payer: Self-pay

## 2017-05-04 DIAGNOSIS — I252 Old myocardial infarction: Secondary | ICD-10-CM | POA: Insufficient documentation

## 2017-05-04 DIAGNOSIS — R011 Cardiac murmur, unspecified: Secondary | ICD-10-CM

## 2017-05-04 DIAGNOSIS — E119 Type 2 diabetes mellitus without complications: Secondary | ICD-10-CM | POA: Insufficient documentation

## 2017-05-10 ENCOUNTER — Telehealth: Payer: Self-pay | Admitting: Cardiovascular Disease

## 2017-05-10 NOTE — Telephone Encounter (Signed)
Patient aware of echo results.

## 2017-05-10 NOTE — Telephone Encounter (Signed)
New message     Pt returning call for Echo results please call

## 2017-06-05 ENCOUNTER — Ambulatory Visit: Payer: Self-pay | Admitting: Gastroenterology

## 2017-06-23 ENCOUNTER — Ambulatory Visit: Payer: Managed Care, Other (non HMO) | Admitting: Endocrinology

## 2017-08-01 ENCOUNTER — Ambulatory Visit: Payer: Self-pay | Admitting: Gastroenterology

## 2017-08-07 ENCOUNTER — Other Ambulatory Visit: Payer: Self-pay

## 2017-08-07 ENCOUNTER — Encounter: Payer: Self-pay | Admitting: Endocrinology

## 2017-08-07 ENCOUNTER — Ambulatory Visit (INDEPENDENT_AMBULATORY_CARE_PROVIDER_SITE_OTHER): Payer: Managed Care, Other (non HMO) | Admitting: Endocrinology

## 2017-08-07 VITALS — BP 120/76 | HR 110 | Temp 99.1°F | Ht 68.0 in | Wt 374.2 lb

## 2017-08-07 DIAGNOSIS — E1165 Type 2 diabetes mellitus with hyperglycemia: Secondary | ICD-10-CM

## 2017-08-07 DIAGNOSIS — I10 Essential (primary) hypertension: Secondary | ICD-10-CM

## 2017-08-07 DIAGNOSIS — Z794 Long term (current) use of insulin: Secondary | ICD-10-CM | POA: Diagnosis not present

## 2017-08-07 LAB — POCT GLYCOSYLATED HEMOGLOBIN (HGB A1C): HEMOGLOBIN A1C: 7.3

## 2017-08-07 MED ORDER — FREESTYLE LIBRE SENSOR SYSTEM MISC: 3 refills | Status: DC

## 2017-08-07 MED ORDER — FREESTYLE LIBRE READER DEVI: 1.0000 | 0 refills | Status: DC

## 2017-08-07 MED ORDER — INSULIN REGULAR HUMAN (CONC) 500 UNIT/ML ~~LOC~~ SOPN: PEN_INJECTOR | SUBCUTANEOUS | 3 refills | Status: DC

## 2017-08-07 MED ORDER — SEMAGLUTIDE(0.25 OR 0.5MG/DOS) 2 MG/1.5ML ~~LOC~~ SOPN: 0.5000 mg | PEN_INJECTOR | SUBCUTANEOUS | 3 refills | Status: DC

## 2017-08-07 NOTE — Progress Notes (Signed)
Patient ID: Henry Vasquez, male   DOB: 1966-08-09, 51 y.o.   MRN: [id number]          Reason for Appointment: Consultation for Type 2 Diabetes  Referring physician: Rankins   History of Present Illness:          Date of diagnosis of type 2 diabetes mellitus: 2004        Background history:    At initial diagnosis he had symptoms of increased thirst, confusion and increased blood sugar He was treated mostly with metformin and possibly Avandia, no detailed history isn't available He started taking insulin about 5 years later and metformin has been continued He is new to the area as of 2018 and previous records not available He thinks he was seen by an endocrinologist about 7 years ago but not regularly and has been managed mostly by PCPs  Recent history:   INSULIN regimen is:  Humalog 40-60 units tid pc, Basaglar 200 units bid     Non-insulin hypoglycemic drugs the patient is taking are: Metformin 850 3 times a day  Current management, blood sugar patterns and problems identified:    he has been taking very large doses of insulin especially Lantus for several years  He thinks his dose has been about the same for the last couple of years  He tries to look at carbohydrates before adjusting his insulin but he thinks he is using a 1:2 carbohydrate coverage  Usually with meals is consuming about 120 g of carbohydrate; also most of his meals are high fat and frequently eating out because of his work  He takes his Humalog right after eating usually; he draws of his insulin in a syringe and takes it with him and has done this for years  He is concerned about having to take multiple injections  Does not think he has ever been on Byetta, Victoza or Invokana for treatment  He has not been able to lose weight  Also does not exercise except a little walking with his dog or at work  Mostly he checks his blood sugars in the mornings but at times will check them around lunch or supper  time but not after supper  Recently his blood sugars are being checked mostly in the morning        Side effects from medications have been: None  Compliance with the medical regimen: Fair Hypoglycemia: None    Glucose monitoring:  1- 2 times a day         Glucometer: Prime       Blood Glucose readings by time of day and averages for the last 2 weeks from meter download:  PREMEAL Breakfast Lunch Dinner Bedtime  Overall   Glucose range:  114-220  110 168,274  112-209    Median: 168    178   POST-MEAL PC Breakfast PC Lunch PC Dinner  Glucose range: ?  230    Median:      Self-care: The diet that the patient has been following is: tries to limit sugars His said that his diet may be high in carbohydrates since his wife also likes to eat high carbohydrate meals      Meal times are:  Breakfast is at 7 AM Lunch: 2 PM Dinner: 6:30 PM    Typical meal intake: Breakfast is ham Biscuits Snacks mostly chips                Dietician visit, most recent:2004  Exercise: minimal   Weight history:  Wt Readings from Last 3 Encounters:  08/07/17 (!) 374 lb 3.2 oz (169.7 kg)  04/20/17 (!) 376 lb (170.6 kg)    Glycemic control:   Lab Results  Component Value Date   HGBA1C 7.3 08/07/2017   HGBA1C (H) 09/06/2007    7.2 (NOTE)   The ADA recommends the following therapeutic goals for glycemic   control related to Hgb A1C measurement:   Goal of Therapy:   < 7.0% Hgb A1C   Action Suggested:  > 8.0% Hgb A1C   Ref:  Diabetes Care, 22, Suppl. 1, 1999   Lab Results  Component Value Date   [facility]  09/07/2007    75        Total Cholesterol/HDL:CHD Risk Coronary Heart Disease Risk Table                     Men   Women  1/2 Average Risk   3.4   3.3   CREATININE 0.99 10/21/2007   No results found for: MICRALBCREAT  No results found for: FRUCTOSAMINE    Allergies as of 08/07/2017      Reactions   Levemir [insulin Detemir] Hives      Medication List       Accurate as of  08/07/17  9:23 PM. Always use your most recent med list.          albuterol 108 (90 Base) MCG/ACT inhaler Commonly known as:  PROVENTIL HFA;VENTOLIN HFA Inhale 2 puffs into the lungs every 6 (six) hours as needed for wheezing or shortness of breath.   amLODipine 5 MG tablet Commonly known as:  NORVASC Take 5 mg by mouth daily.   aspirin EC 81 MG tablet Take 81 mg by mouth daily.   BASAGLAR KWIKPEN 100 UNIT/ML Sopn INJECT 200 UNITS TWICE DAILY SUBCUTANEOUS 30 DAYS   escitalopram 10 MG tablet Commonly known as:  LEXAPRO Take 10 mg by mouth daily.   esomeprazole 40 MG capsule Commonly known as:  NEXIUM Take 40 mg by mouth daily at 12 noon.   FREESTYLE LIBRE READER Devi 1 Device by Does not apply route as directed.   FREESTYLE LIBRE SENSOR SYSTEM Misc Apply to upper arm and change sensor every 10 days   HUMALOG 100 UNIT/ML injection Generic drug:  insulin lispro Inject 60 Units into the skin 3 (three) times daily before meals.   insulin regular human CONCENTRATED 500 UNIT/ML kwikpen Commonly known as:  HUMULIN R U-500 KWIKPEN Inject 120 units before each meal.   isosorbide mononitrate 30 MG 24 hr tablet Commonly known as:  IMDUR Take 90 mg by mouth daily. TAKE THREE TABLETS BY MOUTH  IN THE AM   lisinopril 5 MG tablet Commonly known as:  PRINIVIL,ZESTRIL Take 5 mg by mouth daily.   metFORMIN 850 MG tablet Commonly known as:  GLUCOPHAGE Take 850 mg by mouth 3 (three) times daily.   nitroGLYCERIN 0.4 MG SL tablet Commonly known as:  NITROSTAT Place 1 tablet (0.4 mg total) under the tongue every 5 (five) minutes as needed for chest pain.   Semaglutide 0.25 or 0.5 MG/DOSE Sopn Commonly known as:  OZEMPIC Inject 0.5 mg into the skin once a week.       Allergies:  Allergies  Allergen Reactions  . Levemir [Insulin Detemir] Hives    Past Medical History:  Diagnosis Date  . Anxiety   . Asthma   . Barrett esophagus   . Depression   .  Diabetes mellitus  without complication (HCC)   . Heart attack (HCC) 10/2011  . Hiatal hernia   . History of Prinzmetal angina     Past Surgical History:  Procedure Laterality Date  . CHOLECYSTECTOMY    . CLEFT LIP REPAIR    . CLEFT PLATE REPAIR    . TONSILLECTOMY      Family History  Problem Relation Age of Onset  . Other Mother        UNKNOWN  . Other Father        UNKNOWN  . Bipolar disorder Sister   . Drug abuse Sister   . Diabetes Maternal Grandmother   . Heart disease Maternal Grandfather     Social History:  reports that he has never smoked. He has never used smokeless tobacco. He reports that he does not use drugs. His alcohol history is not on file.   Review of Systems  Constitutional: Positive for malaise. Negative for weight loss.       For the last couple days has had fever and cough treated with amoxicillin  Respiratory:       No recent shortness of breath  Cardiovascular: Negative for chest pain.       No palpitations but he feels his heart rate is usually    Gastrointestinal: Negative for diarrhea and abdominal pain.  Endocrine: Negative for fatigue and light-headedness.  Musculoskeletal: Negative for back pain.  Neurological: Positive for tingling.  Psychiatric/Behavioral:       Depression is controlled     Lipid history:  His last LDL was 71 with PCP    Lab Results  Component Value Date   CHOL  09/07/2007    132        ATP III CLASSIFICATION:  <200     mg/dL   Desirable  295-621  mg/dL   Borderline High  >=308    mg/dL   High   HDL 31 (L) 65/78/4696   LDLCALC  09/07/2007    75        Total Cholesterol/HDL:CHD Risk Coronary Heart Disease Risk Table                     Men   Women  1/2 Average Risk   3.4   3.3   TRIG 128 09/07/2007   CHOLHDL 4.3 09/07/2007           Hypertension:Treated with amlodipine and lisinopril  Most recent eye exam was 2017  with no reported retinopathy, report not available  Most recent foot exam:  07/2017    LABS:  Last microalbumin/creatinine ratio was 35.2  Physical Examination:  BP 120/76   Pulse (!) 110   Temp 99.1 F (37.3 C)   Ht  (1.727 m)   Wt (!) 374 lb 3.2 oz (169.7 kg)   SpO2 96%   BMI 56.90 kg/m   GENERAL:         Patient has Severe generalized obesity.   HEENT:         Eye exam shows normal external appearance. Fundus exam shows no retinopathy. Oral exam shows normal mucosa .  NECK:   There is no lymphadenopathy Thyroid is not enlarged and no nodules felt.  Carotids are normal to palpation and no bruit heard LUNGS:         Chest is symmetrical. Lungs are clear to auscultation.Marland Kitchen   HEART:         Heart sounds:  S1 and S2 are normal. No  murmur or click heard., no S3 or S4.   ABDOMEN:   There is no distention present. Liver and spleen are not palpable. No other mass or tenderness present.   NEUROLOGICAL:   Ankle jerks are absent bilaterally.    Diabetic Foot Exam - Simple   Simple Foot Form Diabetic Foot exam was performed with the following findings:  Yes 08/07/2017  5:21 PM  Visual Inspection No deformities, no ulcerations, no other skin breakdown bilaterally:  Yes Sensation Testing Intact to touch and monofilament testing bilaterally:  Yes See comments:  Yes Pulse Check Posterior Tibialis and Dorsalis pulse intact bilaterally:  Yes Comments Mild decrease in monofilament sensation distally on the left first and second toes, questionably decrease on the right distal toes            Vibration sense is Moderately reduced in distal first toes. MUSCULOSKELETAL:  There is no swelling or deformity of the peripheral joints. Spine is normal to inspection.   EXTREMITIES:     There is no edema. No skin lesions present.Marland Kitchen SKIN:       No rash or lesions of concern.        ASSESSMENT:  Diabetes type 2, with morbid obesity     See history of present illness for detailed discussion of current diabetes management, blood sugar patterns and problems  identified  Currently taking over 500 units of insulin a day, mostly with the basal insulin Although his A1c is just over 7% his blood sugars maybe higher after meals and he is not monitoring these regularly  His main difficulty also his watching his diet with regard to portions, carbohydrates and high-fat intake Not able to exercise much and has not been committed to losing weight   Complications of diabetes: probable neuropathy  Multiple medical problems including depression, acid reflux, history of Prinzmetal angina and Barrett's esophagus   PLAN:     Trial of Humulin R U-500 insulin as his main insulin to improve efficacy, reduce injection volume and number of injections.  Discussed in detail how this is different from normal insulin, action profile of the insulin including timing of the injection and duration of action  Showed him how the KwikPen for this is used, given him co-pay card and basic information  He will start with 100 units before each meal daily  He will titrate the dose based on blood sugar readings 3-4 hours after the injection and adjust the dose up or down 10 units as needed  Will try to taper off his Basaglar insulin and he can reduce the dose to 80 units twice a day for now  He is also good candidate for GLP-1 drug because of his massive obesity, insulin resistance, difficulty controlling portions and carbohydrates Discussed with the patient the nature of GLP-1 drugs, the action on various organ systems, how they benefit blood glucose control, as well as the benefit of weight loss and  increase satiety . Explained possible side effects, particularly nausea and vomiting that usually resolve over time; discussed safety information in package insert. Demonstrated the medication injection device and injection technique to the patient.  Showed patient where to inject the medication. To start with 0.25 mg OZEMPIC dosage weekly for the first 2 weeks and then 0.5 mg daily  if tolerated Patient brochure on  OZEMPIC with enclosed co-pay card given  Consultation with nurse educator and dietitian  Encouraged him to start walking for exercise  More consistent monitoring after meals especially with adjusting insulin dose  He will benefit from using the freestyle [place] system and explained to him how this is used and data obtained.  He will started using this if this is covered by his insurance, prescription has been sent  Follow-up in 3 weeks with fructosamine  Patient Instructions  If using  the freestyle Libre sensor make sure you checked her sugar at least every 8 hours  Check blood sugars on waking up  daily  Also check blood sugars about 2 hours after a meal and do check after different meals by rotation  Recommended blood sugar levels on waking up is 90-130 and about 2 hours after meal is 130-160  Please bring your blood sugar monitor to each visit, thank you  Start OZEMPIC using the pen as shown once weekly on the same day of the week. .  You may inject in the stomach, thigh or arm as indicated in the brochure given.   Start using 0.25 mg for the first and second injection and then 0.5 mg if no nausea  You will feel fullness of the stomach with starting the medication and should try to keep the portions at meals small.  You may experience nausea in the first few days which usually gets better over time   If any questions or concerns are present call the office or the  [facility] Answers Center at [phone]. Also visit .[email or web address] website for more useful information   BASAGLAR: Reduce this to 8 units twice a day  Humulin R U-500 insulin: Start with 100 units before breakfast, 100 units before lunch and 80 units before dinner  May add or subtract 10-20 units based on the size of the meal  Try to adjust the dose to keep the blood sugar before the next meal below at least 180  IMPORTANT: Try to take this 30 minutes before eating     Consultation  note has been sent to the referring physician  Counseling time on subjects discussed in assessment and plan sections is over 50% of today's 60 minute visit   Keiera Strathman 08/07/2017, 9:23 PM   Note: This office note was prepared with Dragon voice recognition system technology. Any transcriptional errors that result from this process are unintentional.

## 2017-08-07 NOTE — Patient Instructions (Signed)
If using  the freestyle Libre sensor make sure you checked her sugar at least every 8 hours  Check blood sugars on waking up  daily  Also check blood sugars about 2 hours after a meal and do check after different meals by rotation  Recommended blood sugar levels on waking up is 90-130 and about 2 hours after meal is 130-160  Please bring your blood sugar monitor to each visit, thank you  Start OZEMPIC using the pen as shown once weekly on the same day of the week. .  You may inject in the stomach, thigh or arm as indicated in the brochure given.   Start using 0.25 mg for the first and second injection and then 0.5 mg if no nausea  You will feel fullness of the stomach with starting the medication and should try to keep the portions at meals small.  You may experience nausea in the first few days which usually gets better over time   If any questions or concerns are present call the office or the  [facility] Answers Center at [phone]. Also visit .[email or web address] website for more useful information   BASAGLAR: Reduce this to 8 units twice a day  Humulin R U-500 insulin: Start with 100 units before breakfast, 100 units before lunch and 80 units before dinner  May add or subtract 10-20 units based on the size of the meal  Try to adjust the dose to keep the blood sugar before the next meal below at least 180  IMPORTANT: Try to take this 30 minutes before eating

## 2017-08-08 ENCOUNTER — Telehealth: Payer: Self-pay | Admitting: Endocrinology

## 2017-08-08 NOTE — Telephone Encounter (Signed)
Pt states the ozempic needs an [place]

## 2017-08-10 NOTE — Telephone Encounter (Signed)
Can you please start this PA if you have not started already. Thank you!

## 2017-08-14 ENCOUNTER — Telehealth: Payer: Self-pay | Admitting: Endocrinology

## 2017-08-14 NOTE — Telephone Encounter (Signed)
Patient has called three times and needs this addressed immediately. He needs pre-authorization for the following medication: Ozenticks (once weekly injection). His pharmacy is [facility] [place] [facility]). If questions please call patient

## 2017-08-15 NOTE — Telephone Encounter (Signed)
Pharmacy sent to wrong fax I have corrected this and they are faxing PA form now

## 2017-08-15 NOTE — Telephone Encounter (Signed)
Approved.  

## 2017-08-15 NOTE — Telephone Encounter (Signed)
Pt called back returning your call. Please advise, thank you!  Call pt @ [phone]

## 2017-08-15 NOTE — Telephone Encounter (Signed)
Can you do this ASAP? Thank you!!!

## 2017-08-15 NOTE — Telephone Encounter (Signed)
PA for Ozempic was approved from 08/15/17 to 08/15/18 the reference number is [id number]- called patient to let him know

## 2017-08-23 ENCOUNTER — Telehealth: Payer: Self-pay

## 2017-08-23 ENCOUNTER — Ambulatory Visit: Payer: PRIVATE HEALTH INSURANCE | Admitting: Endocrinology

## 2017-08-23 NOTE — Telephone Encounter (Signed)
Yes, weekly.

## 2017-08-23 NOTE — Telephone Encounter (Signed)
   To start with 0.25 mg OZEMPIC dosage weekly for the first 2 weeks and then 0.5 mg daily if tolerated- this is your last office note please clarify that you mean 0.5 mg weekly if tolerated

## 2017-08-24 ENCOUNTER — Other Ambulatory Visit: Payer: Self-pay

## 2017-08-24 NOTE — Telephone Encounter (Signed)
Spoke to the patient and gave him the instructions- patient stated he has had his first dose of the 0.25 and is having some stomach issues he says it is not intolerable but may become an issue if he goes up to the 0.5- he is going to try the 0.25 again and see how he does and give me a call back if he is continuing to have issues

## 2017-08-31 ENCOUNTER — Ambulatory Visit: Payer: PRIVATE HEALTH INSURANCE | Admitting: Dietician

## 2017-09-03 ENCOUNTER — Encounter: Payer: Self-pay | Admitting: Endocrinology

## 2017-09-14 ENCOUNTER — Other Ambulatory Visit (INDEPENDENT_AMBULATORY_CARE_PROVIDER_SITE_OTHER): Payer: Managed Care, Other (non HMO)

## 2017-09-14 DIAGNOSIS — E1165 Type 2 diabetes mellitus with hyperglycemia: Secondary | ICD-10-CM | POA: Diagnosis not present

## 2017-09-14 DIAGNOSIS — Z794 Long term (current) use of insulin: Secondary | ICD-10-CM | POA: Diagnosis not present

## 2017-09-14 LAB — BASIC METABOLIC PANEL
BUN: 13 mg/dL (ref 6–23)
CALCIUM: 9.6 mg/dL (ref 8.4–10.5)
CO2: 28 meq/L (ref 19–32)
CREATININE: 0.67 mg/dL (ref 0.40–1.50)
Chloride: 98 mEq/L (ref 96–112)
GFR: 132.26 mL/min (ref >60.00)
GLUCOSE: 102 mg/dL — AB (ref 70–99)
Potassium: 3.9 mEq/L (ref 3.5–5.1)
Sodium: 135 mEq/L (ref 135–145)

## 2017-09-15 LAB — FRUCTOSAMINE: Fructosamine: 212 umol/L (ref 0–285)

## 2017-09-18 ENCOUNTER — Ambulatory Visit: Payer: Managed Care, Other (non HMO) | Admitting: Endocrinology

## 2017-09-18 ENCOUNTER — Encounter: Payer: Self-pay | Admitting: Endocrinology

## 2017-09-18 VITALS — BP 108/72 | HR 93 | Ht 68.0 in | Wt 369.4 lb

## 2017-09-18 DIAGNOSIS — Z794 Long term (current) use of insulin: Secondary | ICD-10-CM

## 2017-09-18 DIAGNOSIS — Z23 Encounter for immunization: Secondary | ICD-10-CM | POA: Diagnosis not present

## 2017-09-18 DIAGNOSIS — E1165 Type 2 diabetes mellitus with hyperglycemia: Secondary | ICD-10-CM

## 2017-09-18 NOTE — Patient Instructions (Addendum)
[facility] or other [facility]  Egg McMuffin in am, all sugar free

## 2017-09-18 NOTE — Progress Notes (Signed)
Patient ID: Henry Vasquez, male   DOB: Oct 02, 1966, 51 y.o.   MRN: [id number]          Reason for Appointment: Follow-up for Type 2 Diabetes  Referring physician: Rankins   History of Present Illness:          Date of diagnosis of type 2 diabetes mellitus: 2004        Background history:    At initial diagnosis he had symptoms of increased thirst, confusion and increased blood sugar He was treated mostly with metformin and possibly Avandia, no detailed history isn't available He started taking insulin about 5 years later and metformin has been continued He is new to the area as of 2018 and previous records not available He thinks he was seen by an endocrinologist about 7 years ago but not regularly and has been managed mostly by PCPs  Recent history:   INSULIN regimen is:   Humulin R U-500: 60 units tid , Basaglar 150 units bid     Non-insulin hypoglycemic drugs the patient is taking are: Metformin 850mg  3 times a day, Ozempic 0.25 mg weekly  Current management, blood sugar patterns and problems identified:    because of his very high insulin doses and continued poor control he was started on OZEMPIC in October  However he appears to have had difficulties with diarrhea and nausea with this  He did on his own increase the dose to 0.5 once but this caused frequent diarrhea and he is back on 0.25; he thinks the side effects are tolerable now  However he is able to cut back on his portions and is not as hungry  He is very pleased that he has finally started losing weight  Also has been taking less insulin overall; even though he was told to reduce his Basaglar from 400 units a day down to 100 units a day he is still taking 150 units twice a day  He was also started on U-500 insulin and he is taking only half the recommended dose of 120 units before meals; does not always take this 30 minutes before eating but usually trying to take it 3 times a day  Also he will take a small  dose at bedtime if having a snack except last night  He says he will increase her decrease his dose by 10-15 units on the mealtime insulin based on his meal size on his own  He is still not checking his blood sugars very much and mostly before breakfast and suppertime  However most of his blood sugars are near normal with only sporadic high readings around 150-170 based on his diet  No hypoglycemia  He can still do better with his diet with cutting back on high-fat foods especially fast food breakfast but he does not have time to go see the dietitian        Side effects from medications have been: None  Compliance with the medical regimen: Fair Hypoglycemia: None    Glucose monitoring:  1- 2 times a day         Glucometer: Prime  Mean values apply above for all meters except median for One Touch  PRE-MEAL Fasting Lunch Dinner Bedtime Overall  Glucose range:  95-171  159  82-173  123, 169    Mean/median: 130   131   134     Self-care: The diet that the patient has been following is: tries to limit sugars His said that his diet may be high  in carbohydrates since his wife also likes to eat high carbohydrate meals      Meal times are:  Breakfast is at 7 AM Lunch: 2 PM Dinner: 6:30 PM    Typical meal intake: Breakfast is latte, Biscuits Snacks may be small candy, chips                Dietician visit, most recent:2004               Exercise: minimal   Weight history:  Wt Readings from Last 3 Encounters:  09/18/17 (!) 369 lb 6.4 oz (167.6 kg)  08/07/17 (!) 374 lb 3.2 oz (169.7 kg)  04/20/17 (!) 376 lb (170.6 kg)    Glycemic control:   Lab Results  Component Value Date   HGBA1C 7.3 08/07/2017   HGBA1C (H) 09/06/2007    7.2 (NOTE)   The ADA recommends the following therapeutic goals for glycemic   control related to Hgb A1C measurement:   Goal of Therapy:   < 7.0% Hgb A1C   Action Suggested:  > 8.0% Hgb A1C   Ref:  Diabetes Care, 22, Suppl. 1, 1999   Lab Results    Component Value Date   [facility]  [facility]    75        Total Cholesterol/HDL:CHD Risk Coronary Heart Disease Risk Table                     Men   Women  1/2 Average Risk   3.4   3.3   CREATININE 0.67 09/14/2017   No results found for: [facility]  Lab Results  Component Value Date   FRUCTOSAMINE 212 09/14/2017      Allergies as of 09/18/2017      Reactions   Levemir [insulin Detemir] Hives      Medication List        Accurate as of 09/18/17 10:18 AM. Always use your most recent med list.          albuterol 108 (90 Base) MCG/ACT inhaler Commonly known as:  PROVENTIL HFA;VENTOLIN HFA Inhale 2 puffs into the lungs every 6 (six) hours as needed for wheezing or shortness of breath.   amLODipine 5 MG tablet Commonly known as:  NORVASC Take 5 mg by mouth daily.   aspirin EC 81 MG tablet Take 81 mg by mouth daily.   BASAGLAR KWIKPEN 100 UNIT/ML Sopn INJECT 200 UNITS TWICE DAILY SUBCUTANEOUS 30 DAYS   escitalopram 10 MG tablet Commonly known as:  LEXAPRO Take 10 mg by mouth daily.   esomeprazole 40 MG capsule Commonly known as:  NEXIUM Take 40 mg by mouth daily at 12 noon.   FREESTYLE LIBRE READER Devi 1 Device by Does not apply route as directed.   FREESTYLE LIBRE SENSOR SYSTEM Misc Apply to upper arm and change sensor every 10 days   insulin regular human CONCENTRATED 500 UNIT/ML kwikpen Commonly known as:  HUMULIN R U-500 KWIKPEN Inject 120 units before each meal.   isosorbide mononitrate 30 MG 24 hr tablet Commonly known as:  IMDUR Take 90 mg by mouth daily. TAKE THREE TABLETS BY MOUTH  IN THE AM   lisinopril 5 MG tablet Commonly known as:  PRINIVIL,ZESTRIL Take 5 mg by mouth daily.   metFORMIN 850 MG tablet Commonly known as:  GLUCOPHAGE Take 850 mg by mouth 3 (three) times daily.   nitroGLYCERIN 0.4 MG SL tablet Commonly known as:  NITROSTAT Place 1 tablet (0.4 mg total) under the tongue  every 5 (five) minutes as needed for chest pain.    Semaglutide 0.25 or 0.5 MG/DOSE Sopn Commonly known as:  OZEMPIC Inject 0.5 mg into the skin once a week.       Allergies:  Allergies  Allergen Reactions  . Levemir [Insulin Detemir] Hives    Past Medical History:  Diagnosis Date  . Anxiety   . Asthma   . Barrett esophagus   . Depression   . Diabetes mellitus without complication (HCC)   . Diabetes mellitus, type 2 (HCC)   . GERD (gastroesophageal reflux disease)   . Heart attack (HCC) 10/2011  . Hiatal hernia   . History of Prinzmetal angina   . Obesity   . Prinzmetal angina (HCC)   . Seasonal allergies     Past Surgical History:  Procedure Laterality Date  . CARDIAC CATHETERIZATION  2002,2012  . CHOLECYSTECTOMY    . CLEFT LIP REPAIR    . CLEFT PLATE REPAIR    . TONSILLECTOMY      Family History  Problem Relation Age of Onset  . Other Mother        UNKNOWN  . Other Father        UNKNOWN  . Bipolar disorder Sister   . Drug abuse Sister   . Diabetes Maternal Grandmother   . Dementia Maternal Grandmother   . Heart disease Maternal Grandfather     Social History:  reports that  has never smoked. he has never used smokeless tobacco. He reports that he does not use drugs. His alcohol history is not on file.   Review of Systems   Lipid history:  His last LDL was 71 with PCP    Lab Results  Component Value Date   CHOL  09/07/2007    132        ATP III CLASSIFICATION:  <200     mg/dL   Desirable  161-096200-239  mg/dL   Borderline High  >=045>=240    mg/dL   High   HDL 31 (L) 40/98/119111/05/2007   LDLCALC  09/07/2007    75        Total Cholesterol/HDL:CHD Risk Coronary Heart Disease Risk Table                     Men   Women  1/2 Average Risk   3.4   3.3   TRIG 128 09/07/2007   CHOLHDL 4.3 09/07/2007           Hypertension:Treated with amlodipine and lisinopril  Most recent eye exam was 2017  with no reported retinopathy, report not available  Most recent foot exam: 07/2017    LABS:  Last  microalbumin/creatinine ratio was 35.2  Physical Examination:  BP 108/72   Pulse 93   Ht 5\' 8"  (1.727 m)   Wt (!) 369 lb 6.4 oz (167.6 kg)   SpO2 94%   BMI 56.17 kg/m            ASSESSMENT:  Diabetes type 2, with morbid obesity     See history of present illness for detailed discussion of current diabetes management, blood sugar patterns and problems identified  Blood sugars are markedly improved with the new regimen started last month and fructosamine is down to 212  He is currently on a regimen of Basaglar and U-500 insulin with Ozempic and metformin  He has benefited significantly from starting Ozempic which has cut down his portions and unable to lose weight even with the 0.25 mg  weekly dose He has significant diarrhea with the 0.5 doses and had tried this only once Currently still requiring relatively large amounts of basal insulin with 150 units twice a day of Basaglar He can still do better with his diet as discussed above especially eating out in the morning Also can do better with monitoring glucose more often especially after meals, still has not checked on the freestyle Josephine Igo that was recommended  PLAN:    No change in the Ozempic 0.25 weekly   He needs to try and change his diet significantly especially in the morning and recommended not eating high-fat biscuits.  Discussed alternatives  Since he cannot find time to see the dietitian discussed lowering his carbohydrate intake and high-fat foods  Encourage more exercise.  He will check to see the coverage of the freestyle [place] and discussed again the benefits of using this especially to monitor his postprandial readings and adjust his insulin doses  No change in risk insulin regimen as yet unless he is seeing consistently high readings after any given meal  Follow-up A1c in 2 months  Patient Instructions  Check coverage for [facility] or other meter    Counseling time on subjects discussed in  assessment and plan sections is over 50% of today's 25 minute visit    Atarah Cadogan 09/18/2017, 10:18 AM   Note: This office note was prepared with Dragon voice recognition system technology. Any transcriptional errors that result from this process are unintentional.

## 2017-09-25 ENCOUNTER — Ambulatory Visit: Payer: Managed Care, Other (non HMO) | Admitting: Gastroenterology

## 2017-09-25 ENCOUNTER — Encounter: Payer: Self-pay | Admitting: Gastroenterology

## 2017-09-25 VITALS — BP 130/70 | HR 84 | Ht 68.0 in | Wt 366.0 lb

## 2017-09-25 DIAGNOSIS — K2270 Barrett's esophagus without dysplasia: Secondary | ICD-10-CM | POA: Diagnosis not present

## 2017-09-25 DIAGNOSIS — Z1211 Encounter for screening for malignant neoplasm of colon: Secondary | ICD-10-CM | POA: Diagnosis not present

## 2017-09-25 DIAGNOSIS — K219 Gastro-esophageal reflux disease without esophagitis: Secondary | ICD-10-CM | POA: Diagnosis not present

## 2017-09-25 NOTE — Progress Notes (Signed)
[facility] Consult Note:  History: Henry Vasquez 09/25/2017  Referring physician: Clayborn Heronankins, Victoria R, MD  Reason for consult/chief complaint: barrett's esophagus ([place] in [place] 2 [place]) and Gastroesophageal Reflux   Subjective  HPI:  This is a 51 year old man referred by primary care noted above for history of Barrett's esophagus and reflux.  He reports many years of regurgitation and pyrosis generally under good control with once daily PPI.  He denies dysphagia, odynophagia, nausea, vomiting, early satiety.  He has recently had a purposeful 13 pound weight loss due to dieting and 1 of his new diabetic meds.  He has had a few recent episodes of nocturnal regurgitation which were unusual for him. Records indicate that he was diagnosed in [place] Barrett's esophagus in September 2016, a procedure report indicates a 3 cm segment with no dysplasia.  He has had no previous screening colonoscopy.  He denies change in bowel habits or rectal bleeding.  ROS:  Review of Systems  Constitutional: Negative for appetite change and unexpected weight change.  HENT: Negative for mouth sores and voice change.   Eyes: Negative for pain and redness.  Respiratory: Negative for cough and shortness of breath.   Cardiovascular: Negative for chest pain and palpitations.  Genitourinary: Negative for dysuria and hematuria.  Musculoskeletal: Negative for arthralgias and myalgias.  Skin: Negative for pallor and rash.  Neurological: Negative for weakness and headaches.  Hematological: Negative for adenopathy.     Past Medical History: Past Medical History:  Diagnosis Date  . Anxiety   . Asthma   . Barrett esophagus   . Coronary artery disease   . Depression   . Diabetes mellitus without complication (HCC)   . Diabetes mellitus, type 2 (HCC)   . GERD (gastroesophageal reflux disease)   . Heart attack (HCC) 10/2011  . Hiatal hernia   . History of Prinzmetal angina     . Obesity   . Pneumonia   . Prinzmetal angina (HCC)   . Seasonal allergies   . Sleep apnea   . Stomach ulcer      Past Surgical History: Past Surgical History:  Procedure Laterality Date  . CARDIAC CATHETERIZATION  2002,2012  . CHOLECYSTECTOMY    . CLEFT LIP REPAIR    . CLEFT PLATE REPAIR    . ESOPHAGOGASTRODUODENOSCOPY    . FLEXIBLE SIGMOIDOSCOPY    . TONSILLECTOMY       Family History: Family History  Problem Relation Age of Onset  . Other Mother        borderline personality disorder  . Other Father        Dad unknown to patient  . Bipolar disorder Sister        1/2 sister  . Drug abuse Sister   . Diabetes Maternal Grandmother   . Dementia Maternal Grandmother   . Heart disease Maternal Grandfather     Social History: Social History   Socioeconomic History  . Marital status: Married    Spouse name: None  . Number of children: 0  . Years of education: None  . Highest education level: None  Social Needs  . Financial resource strain: None  . Food insecurity - worry: None  . Food insecurity - inability: None  . Transportation needs - medical: None  . Transportation needs - non-medical: None  Occupational History  . 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 Smoking status: Never Smoker  . Smokeless tobacco: Never Used  Substance and Sexual Activity  .  Alcohol use: No    Frequency: Never    Comment: RARE  . Drug use: No  . Sexual activity: Yes    Comment: MARRIED  Other Topics Concern  . None  Social History Narrative  . None    Allergies: Allergies  Allergen Reactions  . Levemir [Insulin Detemir] Other (See Comments)    Severe pain around site of the injection, redness    Outpatient Meds: Current Outpatient Medications  Medication Sig Dispense Refill  . albuterol (PROVENTIL HFA;VENTOLIN HFA) 108 (90 Base) MCG/ACT inhaler Inhale 2 puffs into the lungs every 6 (six) hours as needed for wheezing or shortness of breath.    Marland Kitchen amLODipine  (NORVASC) 5 MG tablet Take 5 mg by mouth daily.    Marland Kitchen aspirin EC 81 MG tablet Take 81 mg by mouth daily.    Marland Kitchen escitalopram (LEXAPRO) 10 MG tablet Take 10 mg by mouth daily.    Marland Kitchen esomeprazole (NEXIUM) 40 MG capsule Take 40 mg by mouth daily at 12 noon.    . Insulin Glargine (BASAGLAR KWIKPEN) 100 UNIT/ML SOPN INJECT 200 UNITS TWICE DAILY SUBCUTANEOUS 30 DAYS  1  . insulin regular human CONCENTRATED (HUMULIN R U-500 KWIKPEN) 500 UNIT/ML kwikpen Inject 120 units before each meal. 4 pen 3  . isosorbide mononitrate (IMDUR) 30 MG 24 hr tablet Take 90 mg by mouth daily. TAKE THREE TABLETS BY MOUTH  IN THE AM    . lisinopril (PRINIVIL,ZESTRIL) 5 MG tablet Take 5 mg by mouth daily.    . metFORMIN (GLUCOPHAGE) 850 MG tablet Take 850 mg by mouth 3 (three) times daily.    . nitroGLYCERIN (NITROSTAT) 0.4 MG SL tablet Place 1 tablet (0.4 mg total) under the tongue every 5 (five) minutes as needed for chest pain. 25 tablet 3  . Semaglutide (OZEMPIC) 0.25 or 0.5 MG/DOSE SOPN Inject 0.5 mg into the skin once a week. 1 pen 3   No current facility-administered medications for this visit.       ___________________________________________________________________ Objective   Exam:  BP 130/70 (Cuff Size: Large)   Pulse 84   Ht 5\' 8"  (1.727 m)   Wt (!) 366 lb (166 kg)   BMI 55.65 kg/m    General: this is a(n) morbidly obese and well-appearing man  Eyes: sclera anicteric, no redness  ENT: oral mucosa moist without lesions, no cervical or supraclavicular lymphadenopathy, good dentition  CV: RRR without murmur, S1/S2, no JVD, + peripheral edema  Resp: clear to auscultation bilaterally, normal RR and effort noted  GI: soft, no tenderness, with active bowel sounds. No guarding or palpable organomegaly noted, limited by body habitus  Skin; warm and dry, no rash or jaundice noted  Neuro: awake, alert and oriented x 3. Normal gross motor function and fluent speech  Labs:  Previous reports as noted  above Assessment: Encounter Diagnoses  Name Primary?  . Barrett's esophagus without dysplasia Yes  . Gastroesophageal reflux disease, esophagitis presence not specified   . Special screening for malignant neoplasms, colon   . Morbid obesity (HCC)     He has GERD symptoms that are generally stable except for some recent nocturnal episodes.  We discussed the need for nonpharmacologic antireflux measures including dietary advice and the need for weight loss.  He expressed some interest in bariatric surgery, but did not feel he was yet ready for a surgical consultation.  I had a brief discussion with him about the typical bariatric procedures that are performed.  If he feels ready to  do that, he may call us [facility]. Based on current screening guidelines, he should have a repeat upper endoscopy for Barrett's screening around September 2019.  We will put him in for recall, and at that time we will also recommend he have a screening colonoscopy.  He is agreeable to that plan and will call me sooner if the need arises. Thank you for the courtesy of this consult.  Please call me with any questions or concerns.  Charlie PitterHenry L Danis III  CC: Rankins, Fanny DanceVictoria R, MD

## 2017-09-25 NOTE — Patient Instructions (Signed)
If you are age 52 or older, your body mass index should be between 23-30. Your Body mass index is 55.65 kg/m. If this is out of the aforementioned range listed, please consider follow up with your Primary Care Provider.  If you are age 52 or younger, your body mass index should be between 19-25. Your Body mass index is 55.65 kg/m. If this is out of the aformentioned range listed, please consider follow up with your Primary Care Provider.   You will be due for a recall colonoscopy/EGD in 07-2018. We will send you a reminder in the mail when it gets closer to that time.  Thank you for choosing [facility]  Dr Amada JupiterHenry Danis III

## 2017-09-27 ENCOUNTER — Telehealth: Payer: Self-pay

## 2017-09-27 NOTE — Telephone Encounter (Signed)
Patient has been approved for ozempic from 08/15/17 to 08/15/18

## 2017-11-14 ENCOUNTER — Other Ambulatory Visit (INDEPENDENT_AMBULATORY_CARE_PROVIDER_SITE_OTHER): Payer: Managed Care, Other (non HMO)

## 2017-11-14 DIAGNOSIS — E1165 Type 2 diabetes mellitus with hyperglycemia: Secondary | ICD-10-CM

## 2017-11-14 DIAGNOSIS — Z794 Long term (current) use of insulin: Secondary | ICD-10-CM | POA: Diagnosis not present

## 2017-11-14 LAB — BASIC METABOLIC PANEL
BUN: 17 mg/dL (ref 6–23)
CALCIUM: 9.4 mg/dL (ref 8.4–10.5)
CO2: 26 mEq/L (ref 19–32)
CREATININE: 0.73 mg/dL (ref 0.40–1.50)
Chloride: 99 mEq/L (ref 96–112)
GFR: 119.72 mL/min (ref >60.00)
Glucose, Bld: 172 mg/dL — ABNORMAL HIGH (ref 70–99)
Potassium: 4.3 mEq/L (ref 3.5–5.1)
Sodium: 135 mEq/L (ref 135–145)

## 2017-11-14 LAB — HEMOGLOBIN A1C: HEMOGLOBIN A1C: 7.3 % — AB (ref 4.6–6.5)

## 2017-11-17 ENCOUNTER — Ambulatory Visit (INDEPENDENT_AMBULATORY_CARE_PROVIDER_SITE_OTHER): Payer: BLUE CROSS/BLUE SHIELD | Admitting: Endocrinology

## 2017-11-17 ENCOUNTER — Encounter: Payer: Self-pay | Admitting: Endocrinology

## 2017-11-17 VITALS — BP 126/78 | HR 95 | Ht 68.0 in | Wt 363.0 lb

## 2017-11-17 DIAGNOSIS — Z794 Long term (current) use of insulin: Secondary | ICD-10-CM

## 2017-11-17 DIAGNOSIS — E1165 Type 2 diabetes mellitus with hyperglycemia: Secondary | ICD-10-CM | POA: Diagnosis not present

## 2017-11-17 MED ORDER — FREESTYLE LIBRE 14 DAY SENSOR MISC: 1.0000 [IU] | 4 refills | Status: DC

## 2017-11-17 MED ORDER — FREESTYLE LIBRE 14 DAY READER DEVI: 1.0000 | Freq: Once | 0 refills | Status: AC

## 2017-11-17 NOTE — Progress Notes (Signed)
Patient ID: Henry Vasquez, male   DOB: [birth year], 52 y.o.   MRN: [id number]          Reason for Appointment: Follow-up for Type 2 Diabetes  Referring physician: Rankins   History of Present Illness:          Date of diagnosis of type 2 diabetes mellitus: 2004        Background history:    At initial diagnosis he had symptoms of increased thirst, confusion and increased blood sugar He was treated mostly with metformin and possibly Avandia, no detailed history isn't available He started taking insulin about 5 years later and metformin has been continued He is new to the area as of 2018 and previous records not available He thinks he was seen by an endocrinologist about 7 years ago but not regularly and has been managed mostly by PCPs  Recent history:   INSULIN regimen is:   Humulin R U-500: 40-60 units tid , Basaglar 150 units-a.m., 100 units p.m.      Non-insulin hypoglycemic drugs the patient is taking are: Metformin 850mg  3 times a day, Ozempic 0.25 mg weekly   His A1c is still relatively high at 7.3   Current management, blood sugar patterns and problems identified:   He has now been able to tolerate Ozempic 0.25 mg weekly without any GI side effects    now he has been able to cut back his basal insulin another 50 units and is taking 250 units daily; prior to his initial consultation he was taking 400 units daily  He has not changed his regular insulin but with smaller carbohydrate meals and lighter meals he may take as little less 40 units  However he again forgets to check his readings after meals except occasionally  He has a glucose of 213 after breakfast, generally having milk and a biscuit and has difficulty changing his diet  Also in December and early January probably did not watch his diet as well  However he still has managed to lose a little weight  Not doing any formal exercise currently  Has only a couple of readings in the afternoons that the higher  not recently and that sugars at bedtime are variable but usually not high   No hypoglycemia except once  Again he says that he does not have time to go see the dietitian        Side effects from medications have been: None  Compliance with the medical regimen: Fair Hypoglycemia: None    Glucose monitoring:  1- 2 times a day         Glucometer: Prime  Median 139+/-41  PRE-MEAL Fasting Lunch Dinner Bedtime Overall  Glucose range: 93-151   125  115-188  73-256   Mean/median: 132     147   POST-MEAL PC Breakfast PC Lunch PC Dinner  Glucose range: 213  213, 256    Mean/median:         Self-care: The diet that the patient has been following is: tries to limit sugars His said that his diet may be high in carbohydrates at times, he likes to drink milk      Meal times are:  Breakfast is at 7 AM Lunch: 2 PM Dinner: 6:30 PM    Typical meal intake: Breakfast is [employer] latte, Biscuits  Snacks may be small candy, chips                Dietician visit, most recent:2004  Exercise: a little walking with dog  Weight history:  Wt Readings from Last 3 Encounters:  11/17/17 (!) 363 lb (164.7 kg)  09/25/17 (!) 366 lb (166 kg)  09/18/17 (!) 369 lb 6.4 oz (167.6 kg)    Glycemic control:   Lab Results  Component Value Date   HGBA1C 7.3 (H) 11/14/2017   HGBA1C 7.3 08/07/2017   HGBA1C (H) 09/06/2007    7.2 (NOTE)   The ADA recommends the following therapeutic goals for glycemic   control related to Hgb A1C measurement:   Goal of Therapy:   < 7.0% Hgb A1C   Action Suggested:  > 8.0% Hgb A1C   Ref:  Diabetes Care, 22, Suppl. 1, 1999    Last microalbumin/creatinine ratio was 35.2 in 03/2017  Lab Results  Component Value Date   [facility]  09/07/2007    75        Total Cholesterol/HDL:CHD Risk Coronary Heart Disease Risk Table                     Men   Women  1/2 Average Risk   3.4   3.3   CREATININE 0.73 11/14/2017   No results found for: MICRALBCREAT  Lab Results   Component Value Date   FRUCTOSAMINE 212 09/14/2017      Allergies as of 11/17/2017      Reactions   Levemir [insulin Detemir] Other (See Comments)   Severe pain around site of the injection, redness      Medication List        Accurate as of 11/17/17  3:55 PM. Always use your most recent med list.          albuterol 108 (90 Base) MCG/ACT inhaler Commonly known as:  PROVENTIL HFA;VENTOLIN HFA Inhale 2 puffs into the lungs every 6 (six) hours as needed for wheezing or shortness of breath.   amLODipine 5 MG tablet Commonly known as:  NORVASC Take 5 mg by mouth daily.   aspirin EC 81 MG tablet Take 81 mg by mouth daily.   BASAGLAR KWIKPEN 100 UNIT/ML Sopn INJECT 200 UNITS TWICE DAILY SUBCUTANEOUS 30 DAYS   escitalopram 10 MG tablet Commonly known as:  LEXAPRO Take 10 mg by mouth daily.   esomeprazole 40 MG capsule Commonly known as:  NEXIUM Take 40 mg by mouth daily at 12 noon.   FREESTYLE LIBRE 14 DAY READER Devi 1 Device by Does not apply route once for 1 dose.   FREESTYLE LIBRE 14 DAY SENSOR Misc 1 Units by Does not apply route every 14 (fourteen) days.   insulin regular human CONCENTRATED 500 UNIT/ML kwikpen Commonly known as:  HUMULIN R U-500 KWIKPEN Inject 120 units before each meal.   isosorbide mononitrate 30 MG 24 hr tablet Commonly known as:  IMDUR Take 90 mg by mouth daily. TAKE THREE TABLETS BY MOUTH  IN THE AM   lisinopril 5 MG tablet Commonly known as:  PRINIVIL,ZESTRIL Take 5 mg by mouth daily.   metFORMIN 850 MG tablet Commonly known as:  GLUCOPHAGE Take 850 mg by mouth 3 (three) times daily.   nitroGLYCERIN 0.4 MG SL tablet Commonly known as:  NITROSTAT Place 1 tablet (0.4 mg total) under the tongue every 5 (five) minutes as needed for chest pain.   Semaglutide 0.25 or 0.5 MG/DOSE Sopn Commonly known as:  OZEMPIC Inject 0.5 mg into the skin once a week.       Allergies:  Allergies  Allergen Reactions  . Levemir [Insulin  Detemir] Other (See Comments)    Severe pain around site of the injection, redness    Past Medical History:  Diagnosis Date  . Anxiety   . Asthma   . Barrett esophagus   . Coronary artery disease   . Depression   . Diabetes mellitus without complication (HCC)   . Diabetes mellitus, type 2 (HCC)   . GERD (gastroesophageal reflux disease)   . Heart attack (HCC) 10/2011  . Hiatal hernia   . History of Prinzmetal angina   . Obesity   . Pneumonia   . Prinzmetal angina (HCC)   . Seasonal allergies   . Sleep apnea   . Stomach ulcer     Past Surgical History:  Procedure Laterality Date  . CARDIAC CATHETERIZATION  2002,2012  . CHOLECYSTECTOMY    . CLEFT LIP REPAIR    . CLEFT PLATE REPAIR    . ESOPHAGOGASTRODUODENOSCOPY    . FLEXIBLE SIGMOIDOSCOPY    . TONSILLECTOMY      Family History  Problem Relation Age of Onset  . Other Mother        borderline personality disorder  . Other Father        Dad unknown to patient  . Bipolar disorder Sister        1/2 sister  . Drug abuse Sister   . Diabetes Maternal Grandmother   . Dementia Maternal Grandmother   . Heart disease Maternal Grandfather     Social History:  reports that  has never smoked. he has never used smokeless tobacco. He reports that he does not drink alcohol or use drugs.   Review of Systems   Lipid history:  His last LDL was 71 with PCP    Lab Results  Component Value Date   CHOL  09/07/2007    132        ATP III CLASSIFICATION:  <200     mg/dL   Desirable  161-096  mg/dL   Borderline High  >=045    mg/dL   High   HDL 31 (L) 40/98/1191   LDLCALC  09/07/2007    75        Total Cholesterol/HDL:CHD Risk Coronary Heart Disease Risk Table                     Men   Women  1/2 Average Risk   3.4   3.3   TRIG 128 09/07/2007   CHOLHDL 4.3 09/07/2007           Hypertension:Treated with amlodipine and lisinopril  Most recent eye exam was 2017  with no reported retinopathy, report not available  Most  recent foot exam: 07/2017     Physical Examination:  BP 126/78   Pulse 95   Ht 5\' 8"  (1.727 m)   Wt (!) 363 lb (164.7 kg)   SpO2 96%   BMI 55.19 kg/m            ASSESSMENT:  Diabetes type 2, with morbid obesity     See history of present illness for detailed discussion of current diabetes management, blood sugar patterns and problems identified  His A1c is 7.3  Most likely has high postprandial readings since his fasting blood sugars averaging under 140 at least recently He has difficulty monitoring his blood sugars regularly especially while at work during the day and only sporadically at night Also diet may not have been consistent over the last few weeks  He does seem to have relatively high  glycemic index breakfast and other times may have more milk to drink Although he is losing weight he is not exercising enough  He feels that he still been benefiting from Ozempic with portion control He has tried to cut back on his basal insulin when he has had low normal fasting readings and now taking 150 units less BASAGLAR than on his initial consultation   PLAN:    Is a good candidate for using the freestyle Libre sensor for more consistent evaluation of his was prandial blood sugars and he can benefit from this to modify his diet and adjust his insulin better  This was shown in detail and discussed how to use this and he will try to check with his insurance coverage, prescription and co-pay card given  Meanwhile he can continue to use the [employer] brand meter since he does not want to pay for brand name strips  No change in basic regimen of basal bolus insulin regimen including Humulin R U-500 and Ozempic low-dose  Encouraged him to start walking or other exercise especially at lunchtime  He was in the meantime try to check blood sugars more consistently after meals  Meanwhile he will try to cut back on biscuits and other high-fat foods and use more lower glycemic index  meals  Discussed blood sugar targets at various times  Patient Instructions  Low fat meals  Check Libre coverage  Walk daily      Counseling time on subjects discussed in assessment and plan sections is over 50% of today's 25 minute visit    Reather Littlerjay Dorethia Jeanmarie 11/17/2017, 3:55 PM   Note: This office note was prepared with Dragon voice recognition system technology. Any transcriptional errors that result from this process are unintentional.

## 2017-11-17 NOTE — Patient Instructions (Addendum)
Low fat meals  Check Libre coverage  Walk daily

## 2017-11-22 ENCOUNTER — Encounter: Payer: Self-pay | Admitting: Endocrinology

## 2017-12-01 ENCOUNTER — Other Ambulatory Visit: Payer: Self-pay | Admitting: Endocrinology

## 2017-12-01 MED ORDER — INSULIN GLARGINE 100 UNIT/ML SOLOSTAR PEN: PEN_INJECTOR | SUBCUTANEOUS | 1 refills | Status: DC

## 2017-12-10 ENCOUNTER — Other Ambulatory Visit: Payer: Self-pay | Admitting: Endocrinology

## 2017-12-22 ENCOUNTER — Other Ambulatory Visit: Payer: Self-pay

## 2017-12-22 DIAGNOSIS — R011 Cardiac murmur, unspecified: Secondary | ICD-10-CM

## 2017-12-22 MED ORDER — FREESTYLE LIBRE 14 DAY SENSOR MISC: 1.0000 [IU] | 4 refills | Status: DC

## 2017-12-22 MED ORDER — SEMAGLUTIDE(0.25 OR 0.5MG/DOS) 2 MG/1.5ML ~~LOC~~ SOPN: 0.5000 mg | PEN_INJECTOR | SUBCUTANEOUS | 3 refills | Status: DC

## 2017-12-22 MED ORDER — INSULIN REGULAR HUMAN (CONC) 500 UNIT/ML ~~LOC~~ SOPN: PEN_INJECTOR | SUBCUTANEOUS | 2 refills | Status: DC

## 2018-01-05 ENCOUNTER — Telehealth: Payer: Self-pay | Admitting: Endocrinology

## 2018-01-05 NOTE — Telephone Encounter (Signed)
Patient needs RX for Metformin 850 mg tablets 3 x daily (previously prescribed by Dr. Luciana Axeankin PCP) but wants Dr. Lucianne MussKumar to prescribe all medications dealing diabetes. Send RX to [facility] fax# [phone] asap

## 2018-01-08 ENCOUNTER — Other Ambulatory Visit: Payer: Self-pay

## 2018-01-08 MED ORDER — METFORMIN HCL 850 MG PO TABS: 850.0000 mg | ORAL_TABLET | Freq: Three times a day (TID) | ORAL | 2 refills | Status: DC

## 2018-01-08 NOTE — Telephone Encounter (Signed)
Done

## 2018-01-08 NOTE — Telephone Encounter (Signed)
Okay to send prescription for 90-day supply

## 2018-01-19 ENCOUNTER — Telehealth: Payer: Self-pay | Admitting: Endocrinology

## 2018-01-19 ENCOUNTER — Encounter: Payer: Self-pay | Admitting: Endocrinology

## 2018-01-19 NOTE — Telephone Encounter (Signed)
Patient stated that he switched from Basaglar to Lantus preferred by his insurance company. Patient stated stated that the Lantus is not working to well, b/s has been spiking running over 200.  Please advise

## 2018-01-19 NOTE — Telephone Encounter (Signed)
Please find out what time of the day his blood sugars are higher.  The Lantus and Basaglar are identical.  He should also find out if he can get coverage for Evaristo Buryresiba How much of all the insulin doses is he taking?

## 2018-01-19 NOTE — Telephone Encounter (Signed)
Please advise 

## 2018-01-21 NOTE — Telephone Encounter (Signed)
Please check on the status of this call

## 2018-01-22 NOTE — Telephone Encounter (Signed)
Left mess for patient to call back.  

## 2018-01-22 NOTE — Telephone Encounter (Signed)
He needs to find out if Henry Vasquez is covered instead of Basaglar Otherwise he can switch back to [employer] Meantime he can take 150 units of Lantus in the morning and 250 in the evening

## 2018-01-22 NOTE — Telephone Encounter (Signed)
Pt. Says he is up to 200u of Lantus BID (9AM/9PM.  Blood sugars are in the 200s fasting-(200-240), and then reising to over 250 and then dropping down to 125 by 2PM.  Does not eat lunch and at 6PM, blood sugars are in the low 100s.  After supper, blood sugars going over 150 at MN.   On Basaglar, he was doing 150uBID, and blood sugars rarely went over 180.  Can he switch back to [employer]?  He will pay the extra money.  [facility] would [facility] supply

## 2018-01-23 MED ORDER — BASAGLAR KWIKPEN 100 UNIT/ML ~~LOC~~ SOPN: 150.0000 [IU] | PEN_INJECTOR | Freq: Two times a day (BID) | SUBCUTANEOUS | 1 refills | Status: DC

## 2018-01-23 NOTE — Telephone Encounter (Signed)
Pt informed of below. He wants to switch back to [employer]. New Rx sent. See meds.

## 2018-01-28 ENCOUNTER — Other Ambulatory Visit: Payer: Self-pay | Admitting: Endocrinology

## 2018-02-08 ENCOUNTER — Other Ambulatory Visit: Payer: Self-pay | Admitting: Endocrinology

## 2018-02-12 ENCOUNTER — Other Ambulatory Visit: Payer: BLUE CROSS/BLUE SHIELD

## 2018-02-15 ENCOUNTER — Ambulatory Visit: Payer: BLUE CROSS/BLUE SHIELD | Admitting: Endocrinology

## 2018-03-05 ENCOUNTER — Telehealth: Payer: Self-pay

## 2018-03-05 NOTE — Telephone Encounter (Signed)
Patient called today stating his sugar has been running high for 4 to 5 days now in the morning and around bedtime it drops down to 40 or below- please call the patient back to get a few days of his readings for MD to review

## 2018-03-05 NOTE — Telephone Encounter (Signed)
Please advise 

## 2018-03-05 NOTE — Telephone Encounter (Signed)
His fasting reading recently ranges from 98-200 but bedtime readings fluctuate between 91 up to 241 He needs to stop taking 70 units of Humulin R at bedtime which is causing overnight low sugars He needs to take consistent amounts of 40 units of Humulin R in the morning at breakfast Also if eating a larger meal at lunch or supper he will need to take as much as 90 units of Humulin R Also needs to take consistent amounts of Lantus 100 units in the morning and 50 in the evening and not skip the Lantus Humulin R needs to be taken 30 minutes before eating

## 2018-03-05 NOTE — Telephone Encounter (Signed)
Called patient back and gave him MD instructions. Patient verbalized understanding.

## 2018-03-05 NOTE — Telephone Encounter (Signed)
Called patient and got blood sugar readings. Readings were given to MD.

## 2018-03-30 ENCOUNTER — Other Ambulatory Visit (INDEPENDENT_AMBULATORY_CARE_PROVIDER_SITE_OTHER): Payer: BLUE CROSS/BLUE SHIELD

## 2018-03-30 DIAGNOSIS — Z794 Long term (current) use of insulin: Secondary | ICD-10-CM | POA: Diagnosis not present

## 2018-03-30 DIAGNOSIS — E1165 Type 2 diabetes mellitus with hyperglycemia: Secondary | ICD-10-CM | POA: Diagnosis not present

## 2018-03-30 LAB — COMPREHENSIVE METABOLIC PANEL
ALBUMIN: 4.1 g/dL (ref 3.5–5.2)
ALT: 49 U/L (ref 0–53)
AST: 34 U/L (ref 0–37)
Alkaline Phosphatase: 79 U/L (ref 39–117)
BUN: 15 mg/dL (ref 6–23)
CO2: 28 mEq/L (ref 19–32)
Calcium: 9.1 mg/dL (ref 8.4–10.5)
Chloride: 98 mEq/L (ref 96–112)
Creatinine, Ser: 0.73 mg/dL (ref 0.40–1.50)
GFR: 119.55 mL/min (ref >60.00)
GLUCOSE: 141 mg/dL — AB (ref 70–99)
POTASSIUM: 3.8 meq/L (ref 3.5–5.1)
SODIUM: 135 meq/L (ref 135–145)
Total Bilirubin: 0.4 mg/dL (ref 0.2–1.2)
Total Protein: 7.3 g/dL (ref 6.0–8.3)

## 2018-03-30 LAB — LIPID PANEL
CHOLESTEROL: 128 mg/dL (ref 0–200)
HDL: 34.9 mg/dL — ABNORMAL LOW (ref >39.00)
LDL Cholesterol: 68 mg/dL (ref 0–99)
NONHDL: 93.16
Total CHOL/HDL Ratio: 4
Triglycerides: 126 mg/dL (ref 0.0–149.0)
VLDL: 25.2 mg/dL (ref 0.0–40.0)

## 2018-03-30 LAB — MICROALBUMIN / CREATININE URINE RATIO
CREATININE, U: 114.3 mg/dL
MICROALB UR: 2.8 mg/dL — AB (ref 0.0–1.9)
MICROALB/CREAT RATIO: 2.5 mg/g (ref 0.0–30.0)

## 2018-03-30 LAB — HEMOGLOBIN A1C: HEMOGLOBIN A1C: 7.1 % — AB (ref 4.6–6.5)

## 2018-04-03 ENCOUNTER — Encounter: Payer: Self-pay | Admitting: Endocrinology

## 2018-04-03 ENCOUNTER — Ambulatory Visit: Payer: BLUE CROSS/BLUE SHIELD | Admitting: Endocrinology

## 2018-04-03 VITALS — BP 138/70 | HR 95 | Ht 68.0 in | Wt 357.6 lb

## 2018-04-03 DIAGNOSIS — E1165 Type 2 diabetes mellitus with hyperglycemia: Secondary | ICD-10-CM | POA: Diagnosis not present

## 2018-04-03 DIAGNOSIS — Z794 Long term (current) use of insulin: Secondary | ICD-10-CM

## 2018-04-03 DIAGNOSIS — I10 Essential (primary) hypertension: Secondary | ICD-10-CM

## 2018-04-03 NOTE — Patient Instructions (Signed)
Add 10 units at lunch of R and 5-10 less at supper  Try injecting before meals

## 2018-04-03 NOTE — Progress Notes (Signed)
Patient ID: Henry LusterRobert Tacey, male   DOB: 09/23/1966, 52 y.o.   MRN: [id number]          Reason for Appointment: Follow-up for Type 2 Diabetes  Referring physician: Rankins   History of Present Illness:          Date of diagnosis of type 2 diabetes mellitus: 2004        Background history:    At initial diagnosis he had symptoms of increased thirst, confusion and increased blood sugar He was treated mostly with metformin and possibly Avandia, no detailed history isn't available He started taking insulin about 5 years later and metformin has been continued He is new to the area as of 2018 and previous records not available He thinks he was seen by an endocrinologist about 7 years ago but not regularly and has been managed mostly by PCPs  Recent history:   INSULIN regimen is:   Humulin R U-500: 40-30-60 units tid , Basaglar 150 units-a.m., 100 units p.m.      Non-insulin hypoglycemic drugs the patient is taking are: Metformin 850mg  3 times a day, Ozempic 0.25 mg weekly   His A1c is better at 7.1, was 7.3  Current management, blood sugar patterns and problems identified:   He has now been able to use the freestyle libre system and he finds this more informative and is able to check sugars much more frequently  Unable to download this today and difficult to know what his blood sugar patterns are in detail  HYPOGLYCEMIA has been occasional and appears to have some low readings at bedtime and possibly overnight or early morning  This is less since he was told to stop his bedtime Humulin regular insulin about 4 weeks ago when he called  HYPERGLYCEMIA is mostly after lunch especially last week or so when he was checking his blood sugars  He says that he tries to take less insulin at lunchtime because of fear of getting low in the afternoon when he is busier and going out with his work although not doing any formal exercise or walking significantly  Also when he called last night he  was not taking his Lantus consistently at night  Continues to benefit from Ozempic and is losing some weight  Again he says that he does not have time to go see the dietitian        Side effects from medications have been: None  Compliance with the medical regimen: Fair Hypoglycemia: None    Glucose monitoring:  1- 2 times a day         Glucometer:   Freestyle libre  Averages for the last 30 days as follows  For the last week or so blood sugars are averaging about 190 after lunch and 110-120 at bedtime  PRE-MEAL Fasting Lunch Dinner Bedtime Overall  Glucose range:     45-?   Mean/median: ?  100    135 130   POST-MEAL PC Breakfast PC Lunch PC Dinner  Glucose range:     Mean/median:   155      Self-care: The diet that the patient has been following is: tries to limit sugars His said that his diet may be high in carbohydrates at times, he likes to drink milk      Meal times are:  Breakfast is at 7 AM Lunch: 2 PM Dinner: 6:30 PM    Typical meal intake: Breakfast is [employer] latte, Biscuits  Snacks may be small candy, chips  Dietician visit, most recent:2004               Exercise: a little walking with dog  Weight history:  Wt Readings from Last 3 Encounters:  04/03/18 (!) 357 lb 9.6 oz (162.2 kg)  11/17/17 (!) 363 lb (164.7 kg)  09/25/17 (!) 366 lb (166 kg)    Glycemic control:   Lab Results  Component Value Date   HGBA1C 7.1 (H) 03/30/2018   HGBA1C 7.3 (H) 11/14/2017   HGBA1C 7.3 08/07/2017    Last microalbumin/creatinine ratio was 35.2 in 03/2017  Lab Results  Component Value Date   MICROALBUR 2.8 (H) 03/30/2018   LDLCALC 68 03/30/2018   CREATININE 0.73 03/30/2018   Lab Results  Component Value Date   MICRALBCREAT 2.5 03/30/2018    Lab Results  Component Value Date   FRUCTOSAMINE 212 09/14/2017      Allergies as of 04/03/2018      Reactions   Levemir [insulin Detemir] Other (See Comments)   Severe pain around site of the  injection, redness      Medication List        Accurate as of 04/03/18  4:41 PM. Always use your most recent med list.          albuterol 108 (90 Base) MCG/ACT inhaler Commonly known as:  PROVENTIL HFA;VENTOLIN HFA Inhale 2 puffs into the lungs every 6 (six) hours as needed for wheezing or shortness of breath.   amLODipine 5 MG tablet Commonly known as:  NORVASC Take 5 mg by mouth daily.   aspirin EC 81 MG tablet Take 81 mg by mouth daily.   BASAGLAR KWIKPEN 100 UNIT/ML Sopn Inject 1.5 mLs (150 Units total) into the skin 2 (two) times daily.   escitalopram 10 MG tablet Commonly known as:  LEXAPRO Take 10 mg by mouth daily.   esomeprazole 40 MG capsule Commonly known as:  NEXIUM Take 40 mg by mouth daily at 12 noon.   FREESTYLE LIBRE 14 DAY SENSOR Misc 1 Units by Does not apply route every 14 (fourteen) days.   insulin regular human CONCENTRATED 500 UNIT/ML kwikpen Commonly known as:  HUMULIN R U-500 KWIKPEN INJECT 120 UNITS BEFORE EACH MEAL.   isosorbide mononitrate 30 MG 24 hr tablet Commonly known as:  IMDUR Take 90 mg by mouth daily. TAKE THREE TABLETS BY MOUTH  IN THE AM   lisinopril 5 MG tablet Commonly known as:  PRINIVIL,ZESTRIL Take 5 mg by mouth daily.   metFORMIN 850 MG tablet Commonly known as:  GLUCOPHAGE Take 1 tablet (850 mg total) by mouth 3 (three) times daily.   nitroGLYCERIN 0.4 MG SL tablet Commonly known as:  NITROSTAT Place 1 tablet (0.4 mg total) under the tongue every 5 (five) minutes as needed for chest pain.   OZEMPIC 0.25 or 0.5 MG/DOSE Sopn Generic drug:  Semaglutide INJECT 0.5 MG INTO THE SKIN ONCE A WEEK.       Allergies:  Allergies  Allergen Reactions  . Levemir [Insulin Detemir] Other (See Comments)    Severe pain around site of the injection, redness    Past Medical History:  Diagnosis Date  . Anxiety   . Asthma   . Barrett esophagus   . Coronary artery disease   . Depression   . Diabetes mellitus without  complication (HCC)   . Diabetes mellitus, type 2 (HCC)   . GERD (gastroesophageal reflux disease)   . Heart attack (HCC) 10/2011  . Hiatal hernia   . History of  Prinzmetal angina   . Obesity   . Pneumonia   . Prinzmetal angina (HCC)   . Seasonal allergies   . Sleep apnea   . Stomach ulcer     Past Surgical History:  Procedure Laterality Date  . CARDIAC CATHETERIZATION  2002,2012  . CHOLECYSTECTOMY    . CLEFT LIP REPAIR    . CLEFT PLATE REPAIR    . ESOPHAGOGASTRODUODENOSCOPY    . FLEXIBLE SIGMOIDOSCOPY    . TONSILLECTOMY      Family History  Problem Relation Age of Onset  . Other Mother        borderline personality disorder  . Other Father        Dad unknown to patient  . Bipolar disorder Sister        1/2 sister  . Drug abuse Sister   . Diabetes Maternal Grandmother   . Dementia Maternal Grandmother   . Heart disease Maternal Grandfather     Social History:  reports that he has never smoked. He has never used smokeless tobacco. He reports that he does not drink alcohol or use drugs.   Review of Systems   Lipid history:  His last LDL was 68  Not on statin drugs    Lab Results  Component Value Date   CHOL 128 03/30/2018   HDL 34.90 (L) 03/30/2018   LDLCALC 68 03/30/2018   TRIG 126.0 03/30/2018   CHOLHDL 4 03/30/2018           Hypertension:Treated with amlodipine and lisinopril 5 mg  Most recent eye exam was 2017  with no reported retinopathy, report not available  Most recent foot exam: 07/2017     Physical Examination:  BP 138/70 (BP Location: Left Arm, Patient Position: Sitting, Cuff Size: Normal)   Pulse 95   Ht 5\' 8"  (1.727 m)   Wt (!) 357 lb 9.6 oz (162.2 kg)   SpO2 95%   BMI 54.37 kg/m            ASSESSMENT:  Diabetes type 2, with morbid obesity     See history of present illness for detailed discussion of current diabetes management, blood sugar patterns and problems identified  His A1c is 7.1 and fairly consistent now  He  is still taking large doses of insulin especially basal Difficult to get a good blood sugar pattern as he has not been able to share his freestyle libre sensor records and limited review was done on his smart phone Continues to be on Ozempic and weight is coming down Since his blood sugars tend to fluctuate excessively this is likely to be from taking the Humulin R U-500  Discussed with him the day today management of insulin and balancing out his meal size and insulin doses His greatest difficulty is getting consistent insulin regimen at lunchtime and taking it before eating because of his work schedule High sugar may be from also not planning his meals at lunch He takes relatively large amounts of insulin at suppertime and may get low normal or low readings late at night from this However fasting readings although fairly good are variable and tend to be rising  Currently not doing any formal exercise  PLAN:    Is a good candidate for an insulin pump, he is not keen on using a pump with the tubing and showed him in detail how the Omnipod insulin system will work  With his variable schedule and not being able to adjust his insulin at various times of  the day he is a good candidate for this  He will look into this more in more detail and also call us if interested  Again needs to continue to improve his diet with lower fat meals  He will need to have his freestyle libre linked to our website to enable upper review of records  Also to reduce suppertime dose by 5 to 10 units from current regimen so he does not have late night hypoglycemia  Most likely needs 10 more units Humulin R at lunchtime for his usual meals and also try to take this before eating  No change in glargine as yet  Patient Instructions  Add 10 units at lunch of R and 5-10 less at supper  Try injecting before meals    Counseling time on subjects discussed in assessment and plan sections is over 50% of today's 25  minute visit    Reather Littler 04/03/2018, 4:41 PM   Note: This office note was prepared with Dragon voice recognition system technology. Any transcriptional errors that result from this process are unintentional.

## 2018-06-19 ENCOUNTER — Ambulatory Visit (INDEPENDENT_AMBULATORY_CARE_PROVIDER_SITE_OTHER): Payer: BLUE CROSS/BLUE SHIELD | Admitting: Physician Assistant

## 2018-06-19 ENCOUNTER — Encounter: Payer: Self-pay | Admitting: Physician Assistant

## 2018-06-19 VITALS — BP 130/78 | HR 84 | Ht 68.0 in | Wt 358.0 lb

## 2018-06-19 DIAGNOSIS — Z1211 Encounter for screening for malignant neoplasm of colon: Secondary | ICD-10-CM

## 2018-06-19 DIAGNOSIS — Z8719 Personal history of other diseases of the digestive system: Secondary | ICD-10-CM | POA: Diagnosis not present

## 2018-06-19 DIAGNOSIS — K219 Gastro-esophageal reflux disease without esophagitis: Secondary | ICD-10-CM

## 2018-06-19 NOTE — Progress Notes (Signed)
Thank you for sending this case to me. I have reviewed the entire note, and the outlined plan seems appropriate.  Hospital endo case - routine.  Amada JupiterHenry Danis, MD

## 2018-06-19 NOTE — Progress Notes (Signed)
Subjective:    Patient ID: Henry LusterRobert Vasquez, male    DOB: 1966-03-24, 52 y.o.   MRN: [id number]  HPI Henry MaduroRobert is a 52 year old white male, established with Dr. Myrtie Neitheranis, who was initially seen in November 2018.  He has a previously been diagnosed with Barrett's esophagus, had EGD in September 2016 at an outside facility and is indicated for 3-year interval follow-up. He is maintained on Nexium  40 mg p.o. every morning and says that generally this controls his reflux symptoms fairly well.  Patient only where he will have some breakthrough.  He has no complaints of dysphasia or odynophagia. Patient has not had prior colonoscopy.  Family history pertinent for polyps, negative for colon cancer.  Patient related some recent difficulty with episodes of foul belching followed by diarrhea.  He had been using a lot of Stevia in a coffee drink that he was making and thinks that this may have triggered those symptoms.  He has switched off of the Stevia, also is now using a Lactaid milk product rather than regular milk and says he is not having any problems with diarrhea or gas currently.  He did describe some increased thick mucus in his esophagus, which she does not feel is coming from postnasal drainage. Other medical problems include coronary artery disease status post MI, adult onset diabetes mellitus insulin-dependent and anxiety. Review of Systems Pertinent positive and negative review of systems were noted in the above HPI section.  All other review of systems was otherwise negative.  Outpatient Encounter Medications as of 06/19/2018  Medication Sig  . albuterol (PROVENTIL HFA;VENTOLIN HFA) 108 (90 Base) MCG/ACT inhaler Inhale 2 puffs into the lungs every 6 (six) hours as needed for wheezing or shortness of breath.  Marland Kitchen. amLODipine (NORVASC) 5 MG tablet Take 5 mg by mouth daily.  Marland Kitchen. aspirin EC 81 MG tablet Take 81 mg by mouth daily.  . Continuous Blood Gluc Sensor (FREESTYLE LIBRE 14 DAY SENSOR) MISC 1 Units by  Does not apply route every 14 (fourteen) days.  Marland Kitchen. escitalopram (LEXAPRO) 10 MG tablet Take 10 mg by mouth daily.  Marland Kitchen. esomeprazole (NEXIUM) 40 MG capsule Take 40 mg by mouth at bedtime.   . Insulin Glargine (BASAGLAR KWIKPEN) 100 UNIT/ML SOPN Inject 1.5 mLs (150 Units total) into the skin 2 (two) times daily.  . insulin regular human CONCENTRATED (HUMULIN R U-500 KWIKPEN) 500 UNIT/ML kwikpen INJECT 120 UNITS BEFORE EACH MEAL.  . isosorbide mononitrate (IMDUR) 30 MG 24 hr tablet Take 90 mg by mouth daily. TAKE THREE TABLETS BY MOUTH  IN THE AM  . lisinopril (PRINIVIL,ZESTRIL) 5 MG tablet Take 5 mg by mouth daily.  . metFORMIN (GLUCOPHAGE) 850 MG tablet Take 1 tablet (850 mg total) by mouth 3 (three) times daily.  . nitroGLYCERIN (NITROSTAT) 0.4 MG SL tablet Place 1 tablet (0.4 mg total) under the tongue every 5 (five) minutes as needed for chest pain.  Marland Kitchen. OZEMPIC 0.25 or 0.5 MG/DOSE SOPN INJECT 0.5 MG INTO THE SKIN ONCE A WEEK.   No facility-administered encounter medications on file as of 06/19/2018.    Allergies  Allergen Reactions  . Levemir [Insulin Detemir] Other (See Comments)    Severe pain around site of the injection, redness   Patient Active Problem List   Diagnosis Date Noted  . Morbid obesity (HCC) 11/17/2017  . Uncontrolled type 2 diabetes mellitus with hyperglycemia, with long-term current use of insulin (HCC) 08/07/2017  . Gastroesophageal reflux disease with esophagitis 11/04/2016  . History of  MI (myocardial infarction) 11/04/2016  . Other specified anxiety disorders 11/04/2016   Social History   Socioeconomic History  . Marital status: Married    Spouse name: Kennith Center  . Number of children: 0  . Years of education: Not on file  . Highest education level: Not on file  Occupational History  . Occupation: Child psychotherapist  Social Needs  . Financial resource strain: Not on file  . Food insecurity:    Worry: Not on file    Inability: Not on file  . Transportation needs:     Medical: Not on file    Non-medical: Not on file  Tobacco Use  . Smoking status: Never Smoker  . Smokeless tobacco: Never Used  Substance and Sexual Activity  . Alcohol use: No    Frequency: Never    Comment: RARE  . Drug use: No  . Sexual activity: Yes    Comment: MARRIED  Lifestyle  . Physical activity:    Days per week: Not on file    Minutes per session: Not on file  . Stress: Not on file  Relationships  . Social connections:    Talks on phone: Not on file    Gets together: Not on file    Attends religious service: Not on file    Active member of club or organization: Not on file    Attends meetings of clubs or organizations: Not on file    Relationship status: Not on file  . Intimate partner violence:    Fear of current or ex partner: Not on file    Emotionally abused: Not on file    Physically abused: Not on file    Forced sexual activity: Not on file  Other Topics Concern  . Not on file  Social History Narrative  . Not on file    Henry Vasquez's family history includes Bipolar disorder in his sister; Dementia in his maternal grandmother; Diabetes in his maternal grandmother; Drug abuse in his sister; Heart disease in his maternal grandfather; Other in his father and mother.      Objective:    Vitals:   06/19/18 1105  BP: 130/78  Pulse: 84    Physical Exam; well-developed morbidly obese white male in no acute distress, pleasant accompanied by his wife.  Height 5 foot 8, 358, BMI 54.4.  HEENT ;nontraumatic normocephalic EOMI PERRLA sclera anicteric buccal mucosa moist, Cardiovascular; regular rate and rhythm with S1-S2 no murmur rub or gallop, Pulmonary ;clear bilaterally, Abdomen; morbidly obese, soft nontender nondistended bowel sounds are active no palpable mass or hepatosplenomegaly, Rectal ;exam not done, Extremities; no clubbing cyanosis or edema skin warm dry, Neuro psych; alert and oriented, grossly nonfocal mood and affect appropriate         Assessment & Plan:   #74 52 year old white male with history of Barrett's esophagus on the last EGD done in 2016 in [place] with a 3 cm segment of Barrett's, no dysplasia.  Patient due for follow-up surveillance #2 chronic GERD-table #3 morbid obesity-BMI 54.4 #4 colon cancer screening-patient has not had prior colonoscopy #5 recent increase in belching and diarrhea likely exacerbated by significant doses of Stevia-symptoms improved off Stevia and on lactose free milk  #6 adult onset diabetes mellitus insulin-dependent #7 coronary artery disease  Plan; Patient will continue Nexium 40 mg p.o. every morning He will be scheduled for colonoscopy and EGD with Dr. Myrtie Neither at the hospital due to BMI over 50.  Both procedures were discussed in detail with patient including indications  risks and benefits and he is agreeable to proceed.  Talissa Apple Oswald HillockS Pearse Shiffler PA-C 06/19/2018   Cc: Clayborn Heronankins, Victoria R, MD

## 2018-06-19 NOTE — Patient Instructions (Addendum)
You have been scheduled for an endoscopy. Please follow written instructions given to you at your visit today. If you use inhalers (even only as needed), please bring them with you on the day of your procedure. We have given you a prep sample for the colonosocpy/endoscopy.  If you are age 53 or younger, your body mass index should be between 19-25. Your Body mass index is 54.43 kg/m. If this is out of the aformentioned range listed, please consider follow up with your Primary Care Provider.

## 2018-06-22 ENCOUNTER — Ambulatory Visit: Payer: BLUE CROSS/BLUE SHIELD | Admitting: Gastroenterology

## 2018-06-29 ENCOUNTER — Other Ambulatory Visit (INDEPENDENT_AMBULATORY_CARE_PROVIDER_SITE_OTHER): Payer: BLUE CROSS/BLUE SHIELD

## 2018-06-29 DIAGNOSIS — E1165 Type 2 diabetes mellitus with hyperglycemia: Secondary | ICD-10-CM

## 2018-06-29 DIAGNOSIS — Z794 Long term (current) use of insulin: Secondary | ICD-10-CM | POA: Diagnosis not present

## 2018-06-29 LAB — BASIC METABOLIC PANEL
BUN: 9 mg/dL (ref 6–23)
CALCIUM: 9 mg/dL (ref 8.4–10.5)
CO2: 28 meq/L (ref 19–32)
CREATININE: 0.76 mg/dL (ref 0.40–1.50)
Chloride: 100 mEq/L (ref 96–112)
GFR: 114.01 mL/min (ref >60.00)
Glucose, Bld: 126 mg/dL — ABNORMAL HIGH (ref 70–99)
Potassium: 4.3 mEq/L (ref 3.5–5.1)
Sodium: 136 mEq/L (ref 135–145)

## 2018-06-29 LAB — HEMOGLOBIN A1C: HEMOGLOBIN A1C: 7.2 % — AB (ref 4.6–6.5)

## 2018-07-04 ENCOUNTER — Ambulatory Visit (INDEPENDENT_AMBULATORY_CARE_PROVIDER_SITE_OTHER): Payer: BLUE CROSS/BLUE SHIELD | Admitting: Endocrinology

## 2018-07-04 ENCOUNTER — Encounter: Payer: Self-pay | Admitting: Endocrinology

## 2018-07-04 VITALS — BP 136/70 | HR 102 | Ht 68.0 in | Wt 357.8 lb

## 2018-07-04 DIAGNOSIS — Z794 Long term (current) use of insulin: Secondary | ICD-10-CM | POA: Diagnosis not present

## 2018-07-04 DIAGNOSIS — E1165 Type 2 diabetes mellitus with hyperglycemia: Secondary | ICD-10-CM

## 2018-07-04 NOTE — Progress Notes (Signed)
Patient ID: Henry Vasquez, male   DOB: [birth year], 52 y.o.   MRN: [id number]          Reason for Appointment: Follow-up for Type 2 Diabetes  Referring physician: Rankins   History of Present Illness:          Date of diagnosis of type 2 diabetes mellitus: 2004        Background history:    At initial diagnosis he had symptoms of increased thirst, confusion and increased blood sugar He was treated mostly with metformin and possibly Avandia, no detailed history isn't available He started taking insulin about 5 years later and metformin has been continued He is new to the area as of 2018 and previous records not available He thinks he was seen by an endocrinologist about 7 years ago but not regularly and has been managed mostly by PCPs  Recent history:   INSULIN regimen is:   Humulin R U-500: 70-30-45 units tid , Basaglar 150 units-a.m., 100 units p.m.      Non-insulin hypoglycemic drugs the patient is taking are: Metformin 850mg  3 times a day, Ozempic 0.5 mg weekly   His A1c is better at 7.1, was 7.3  Current management, blood sugar patterns and problems identified:   He has now been able to link the freestyle libre system to our account and we were able to print out his readings, last time this was not available  His POSTPRANDIAL readings appear to be fairly consistently high in the mornings and variably in the afternoon  He now says that he is frequently not taking his mealtime insulin before eating and sometimes right at mealtimes or after eating even in the evening when he is home  Today he did take his Humulin R 30 minutes before breakfast and his blood sugar late morning is now only 115  He has gone up a little on his morning insulin partly because his blood sugars are usually high by 8-10 AM when he remembers to take his insulin  Also his blood sugars appear to be LOW sometimes overnight since he is taking his evening insulin around 9 PM and lowest blood sugars are  between 2-4 AM  Blood sugars in the afternoons after lunch are quite variable based on his diet, timing of insulin and estimation of his insulin dose apparently  All his basal insulin has been continued unchanged  Only rarely will have a LOW blood sugar before suppertime  Most of his hypoglycemia appears to be overnight but not consistent  He is still not interested in the insulin pump even though this was discussed again today  He knows to cut back on carbohydrates but does not always do so, also may not always make the best choices when eating out  Again he says that he does not have time to go see the dietitian and he says his wife has started following some instructions given by dietitian        Side effects from medications have been: None  Compliance with the medical regimen: Fair Hypoglycemia: None    Glucose monitoring:  1- 2 times a day         Glucometer:  Freestyle libre   PRE-MEAL Fasting Lunch Dinner Bedtime Overall  Glucose range:       Mean/median:  139  182  146  136 154   POST-MEAL PC Breakfast PC Lunch PC Dinner  Glucose range:     Mean/median:  193  173  166  AVERAGE between 2 AM-4 AM = 117  CGM use % of time  89  Average and SD  154+/-62  Time in range  62     %  % Time Above 180  33  % Time above 250  9  % Time Below target  5       Self-care: The diet that the patient has been following is: tries to limit sugars  His said that his diet may be high in carbohydrates at times, less milk      Meal times are:  Breakfast is at 7 AM Lunch: 2 PM Dinner: 6:30 PM    Typical meal intake: Breakfast is Biscuits  Snacks may be small candy, chips                Dietician visit, most recent:2004               Exercise: a little walking with dog  Weight history:  Wt Readings from Last 3 Encounters:  07/04/18 (!) 357 lb 12.8 oz (162.3 kg)  06/19/18 (!) 358 lb (162.4 kg)  04/03/18 (!) 357 lb 9.6 oz (162.2 kg)    Glycemic control:   Lab Results    Component Value Date   HGBA1C 7.2 (H) 06/29/2018   HGBA1C 7.1 (H) 03/30/2018   HGBA1C 7.3 (H) 11/14/2017    Last microalbumin/creatinine ratio was 35.2 in 03/2017  Lab Results  Component Value Date   MICROALBUR 2.8 (H) 03/30/2018   LDLCALC 68 03/30/2018   CREATININE 0.76 06/29/2018   Lab Results  Component Value Date   MICRALBCREAT 2.5 03/30/2018    Lab Results  Component Value Date   FRUCTOSAMINE 212 09/14/2017      Allergies as of 07/04/2018      Reactions   Levemir [insulin Detemir] Other (See Comments)   Severe pain around site of the injection, redness      Medication List        Accurate as of 07/04/18  2:05 PM. Always use your most recent med list.          albuterol 108 (90 Base) MCG/ACT inhaler Commonly known as:  PROVENTIL HFA;VENTOLIN HFA Inhale 2 puffs into the lungs every 6 (six) hours as needed for wheezing or shortness of breath.   amLODipine 5 MG tablet Commonly known as:  NORVASC Take 5 mg by mouth daily.   aspirin EC 81 MG tablet Take 81 mg by mouth daily.   BASAGLAR KWIKPEN 100 UNIT/ML Sopn Inject 1.5 mLs (150 Units total) into the skin 2 (two) times daily.   escitalopram 10 MG tablet Commonly known as:  LEXAPRO Take 10 mg by mouth daily.   esomeprazole 40 MG capsule Commonly known as:  NEXIUM Take 40 mg by mouth at bedtime.   FREESTYLE LIBRE 14 DAY SENSOR Misc 1 Units by Does not apply route every 14 (fourteen) days.   insulin regular human CONCENTRATED 500 UNIT/ML kwikpen Commonly known as:  HUMULIN R INJECT 120 UNITS BEFORE EACH MEAL.   isosorbide mononitrate 30 MG 24 hr tablet Commonly known as:  IMDUR Take 90 mg by mouth daily. TAKE THREE TABLETS BY MOUTH  IN THE AM   lisinopril 5 MG tablet Commonly known as:  PRINIVIL,ZESTRIL Take 5 mg by mouth daily.   metFORMIN 850 MG tablet Commonly known as:  GLUCOPHAGE Take 1 tablet (850 mg total) by mouth 3 (three) times daily.   nitroGLYCERIN 0.4 MG SL tablet Commonly known  as:  NITROSTAT Place  1 tablet (0.4 mg total) under the tongue every 5 (five) minutes as needed for chest pain.   OZEMPIC 0.25 or 0.5 MG/DOSE Sopn Generic drug:  Semaglutide INJECT 0.5 MG INTO THE SKIN ONCE A WEEK.       Allergies:  Allergies  Allergen Reactions  . Levemir [Insulin Detemir] Other (See Comments)    Severe pain around site of the injection, redness    Past Medical History:  Diagnosis Date  . Anxiety   . Asthma   . Barrett esophagus   . Coronary artery disease   . Depression   . Diabetes mellitus, type 2 (HCC)   . Fistula    mouth  . GERD (gastroesophageal reflux disease)   . Heart attack (HCC) 10/2011  . Hiatal hernia   . History of Prinzmetal angina   . Obesity   . Pneumonia   . Prinzmetal angina (HCC)   . Seasonal allergies   . Sleep apnea   . Stomach ulcer     Past Surgical History:  Procedure Laterality Date  . CARDIAC CATHETERIZATION  2002,2012  . CHOLECYSTECTOMY    . CLEFT LIP REPAIR    . CLEFT PLATE REPAIR     multiple surgeries  . ESOPHAGOGASTRODUODENOSCOPY    . FLEXIBLE SIGMOIDOSCOPY    . TONSILLECTOMY      Family History  Problem Relation Age of Onset  . Other Mother        borderline personality disorder  . Other Father        Dad unknown to patient  . Bipolar disorder Sister        1/2 sister  . Drug abuse Sister   . Diabetes Maternal Grandmother   . Dementia Maternal Grandmother   . Heart disease Maternal Grandfather     Social History:  reports that he has never smoked. He has never used smokeless tobacco. He reports that he does not drink alcohol or use drugs.   Review of Systems   Lipid history:  His last LDL was 68  Not on statin drugs    Lab Results  Component Value Date   CHOL 128 03/30/2018   HDL 34.90 (L) 03/30/2018   LDLCALC 68 03/30/2018   TRIG 126.0 03/30/2018   CHOLHDL 4 03/30/2018           Hypertension:Treated with amlodipine and lisinopril 5 mg  Most recent eye exam was 2017  with no  reported retinopathy, report not available  Most recent foot exam: 07/2017   Physical Examination:  BP 136/70 (BP Location: Right Arm, Patient Position: Sitting, Cuff Size: Normal)   Pulse (!) 102   Ht 5\' 8"  (1.727 m)   Wt (!) 357 lb 12.8 oz (162.3 kg)   SpO2 95%   BMI 54.40 kg/m             ASSESSMENT:  Diabetes type 2, with morbid obesity     See history of present illness for detailed discussion of current diabetes management, blood sugar patterns and problems identified  His A1c is 7.2 and about the same as before  With a regimen of basal bolus insulin including Humulin R U-500 insulin and Tresiba his blood sugars are reasonably well controlled As discussed above he has postprandial hyperglycemia related to not timing his insulin before meals, variable diet and empirically adjusting his insulin Hypoglycemia is usually related to periodically overestimating his insulin need at suppertime which also he takes postprandially at times  Appears to be needing less insulin at suppertime  and most of his Humulin R in the morning  Still taking large doses of insulin including basal and does not want to consider insulin pump as yet  His CGM shows average glucose of 154 but only 62% readings within target but also 5% readings below 70  PLAN:    Discussed day-to-day management of his diabetes, blood sugar patterns and options for treatment  He will try better to time his insulin 30 minutes before eating and he can take his morning insulin at the same time as his Lantus before leaving home  He will adjust his lunchtime dose based on portion size also  He usually will need at least 5 minutes less for his suppertime coverage which will reduce tendency to overnight hypoglycemia  Again asked him to look at the [facility] website to review the advantage of using a pump rather than multiple injections which may also help him with the timing of injections  Patient Instructions  40 Units  BEFORE supper  Check Omnipod    Counseling time on subjects discussed in assessment and plan sections is over 50% of today's 25 minute visit    Henry Vasquez 07/04/2018, 2:05 PM   Note: This office note was prepared with Dragon voice recognition system technology. Any transcriptional errors that result from this process are unintentional.

## 2018-07-04 NOTE — Patient Instructions (Addendum)
40 Units BEFORE supper  Check Omnipod

## 2018-07-23 ENCOUNTER — Telehealth: Payer: Self-pay | Admitting: Gastroenterology

## 2018-07-23 NOTE — Telephone Encounter (Signed)
Patient has a schedule conflict and wishes to reschedule his hospital EGD. November schedule is not available yet so recall placed in our system. Asked patient to call back if he has not heard from us in a couple of weeks.

## 2018-08-06 ENCOUNTER — Encounter (HOSPITAL_COMMUNITY): Payer: Self-pay

## 2018-08-06 ENCOUNTER — Ambulatory Visit (HOSPITAL_COMMUNITY): Admit: 2018-08-06 | Payer: BLUE CROSS/BLUE SHIELD | Admitting: Gastroenterology

## 2018-08-06 SURGERY — COLONOSCOPY WITH PROPOFOL
Anesthesia: Monitor Anesthesia Care

## 2018-08-16 ENCOUNTER — Encounter: Payer: Self-pay | Admitting: Gastroenterology

## 2018-08-20 ENCOUNTER — Telehealth: Payer: Self-pay | Admitting: Gastroenterology

## 2018-08-20 ENCOUNTER — Telehealth: Payer: Self-pay | Admitting: Family

## 2018-08-20 ENCOUNTER — Encounter: Payer: Self-pay | Admitting: Family

## 2018-08-20 ENCOUNTER — Other Ambulatory Visit (INDEPENDENT_AMBULATORY_CARE_PROVIDER_SITE_OTHER): Payer: Managed Care, Other (non HMO)

## 2018-08-20 ENCOUNTER — Ambulatory Visit: Payer: Managed Care, Other (non HMO) | Admitting: Family

## 2018-08-20 ENCOUNTER — Other Ambulatory Visit: Payer: Self-pay

## 2018-08-20 ENCOUNTER — Ambulatory Visit (INDEPENDENT_AMBULATORY_CARE_PROVIDER_SITE_OTHER)
Admission: RE | Admit: 2018-08-20 | Discharge: 2018-08-20 | Disposition: A | Discharge status: Home / Self Care | Payer: Managed Care, Other (non HMO) | Source: Ambulatory Visit | Attending: Family | Admitting: Family

## 2018-08-20 VITALS — BP 134/86 | HR 93 | Temp 98.0°F | Ht 68.0 in | Wt 360.0 lb

## 2018-08-20 DIAGNOSIS — M791 Myalgia, unspecified site: Secondary | ICD-10-CM

## 2018-08-20 DIAGNOSIS — Z23 Encounter for immunization: Secondary | ICD-10-CM | POA: Diagnosis not present

## 2018-08-20 DIAGNOSIS — K22719 Barrett's esophagus with dysplasia, unspecified: Secondary | ICD-10-CM | POA: Diagnosis not present

## 2018-08-20 DIAGNOSIS — I10 Essential (primary) hypertension: Secondary | ICD-10-CM | POA: Diagnosis not present

## 2018-08-20 DIAGNOSIS — Z8679 Personal history of other diseases of the circulatory system: Secondary | ICD-10-CM | POA: Diagnosis not present

## 2018-08-20 DIAGNOSIS — I252 Old myocardial infarction: Secondary | ICD-10-CM

## 2018-08-20 DIAGNOSIS — E1165 Type 2 diabetes mellitus with hyperglycemia: Secondary | ICD-10-CM

## 2018-08-20 DIAGNOSIS — Z794 Long term (current) use of insulin: Secondary | ICD-10-CM

## 2018-08-20 DIAGNOSIS — F418 Other specified anxiety disorders: Secondary | ICD-10-CM

## 2018-08-20 LAB — BASIC METABOLIC PANEL
BUN: 17 mg/dL (ref 6–23)
CO2: 28 meq/L (ref 19–32)
Calcium: 9.3 mg/dL (ref 8.4–10.5)
Chloride: 101 mEq/L (ref 96–112)
Creatinine, Ser: 0.75 mg/dL (ref 0.40–1.50)
GFR: 115.7 mL/min (ref >60.00)
Glucose, Bld: 142 mg/dL — ABNORMAL HIGH (ref 70–99)
Potassium: 4.3 mEq/L (ref 3.5–5.1)
SODIUM: 140 meq/L (ref 135–145)

## 2018-08-20 LAB — VITAMIN D 25 HYDROXY (VIT D DEFICIENCY, FRACTURES): VITD: 30.36 ng/mL (ref 30.00–100.00)

## 2018-08-20 LAB — SEDIMENTATION RATE: SED RATE: 58 mm/h — AB (ref 0–20)

## 2018-08-20 LAB — HEMOGLOBIN A1C: HEMOGLOBIN A1C: 7 % — AB (ref 4.6–6.5)

## 2018-08-20 MED ORDER — LISINOPRIL 5 MG PO TABS: 5.0000 mg | ORAL_TABLET | Freq: Every day | ORAL | 3 refills | Status: DC

## 2018-08-20 MED ORDER — ESOMEPRAZOLE MAGNESIUM 40 MG PO CPDR: 40.0000 mg | DELAYED_RELEASE_CAPSULE | Freq: Every day | ORAL | 3 refills | Status: DC

## 2018-08-20 MED ORDER — ISOSORBIDE MONONITRATE ER 30 MG PO TB24: 90.0000 mg | ORAL_TABLET | Freq: Every day | ORAL | 0 refills | Status: DC

## 2018-08-20 MED ORDER — AMLODIPINE BESYLATE 5 MG PO TABS: 5.0000 mg | ORAL_TABLET | Freq: Every day | ORAL | 3 refills | Status: DC

## 2018-08-20 MED ORDER — ESCITALOPRAM OXALATE 10 MG PO TABS: 10.0000 mg | ORAL_TABLET | Freq: Every day | ORAL | 3 refills | Status: DC

## 2018-08-20 NOTE — Telephone Encounter (Signed)
Called patient and rescheduled EGD/colonoscopy for 10/01/18 at [facility]. Mailed patient new instructions, he already has prep kit. Let Amy H know about date change. Orders already in the system.

## 2018-08-20 NOTE — Telephone Encounter (Signed)
Pls call pt, he needs to r/s endo colon at [facility].

## 2018-08-20 NOTE — Telephone Encounter (Signed)
Copied from CRM [id number]. Topic: Quick Communication - Rx Refill/Question >> Aug 20, 2018  3:31 PM Maia Petties wrote: Medication: esomeprazole (NEXIUM) 40 MG capsule & escitalopram (LEXAPRO) 10 MG tablet - pt states that his insurance advised PA is required for medication - please f/u with pt

## 2018-08-20 NOTE — Patient Instructions (Signed)
Please call to schedule a follow-up with GI regarding your colonoscopy/ endoscopy;

## 2018-08-20 NOTE — Progress Notes (Signed)
Henry Vasquez is a 52 y.o. male with the following history as recorded in Epic[employer]:  Patient Active Problem List   Diagnosis Date Noted  . Morbid obesity ([place]) 11/17/2017  . Uncontrolled type 2 diabetes mellitus with hyperglycemia, with long-term current use of insulin ([place]) 08/07/2017  . Gastroesophageal reflux disease with esophagitis 11/04/2016  . History of MI (myocardial infarction) 11/04/2016  . Other specified anxiety disorders 11/04/2016    Current Outpatient Medications  Medication Sig Dispense Refill  . albuterol (PROVENTIL HFA;VENTOLIN HFA) 108 (90 Base) MCG/ACT inhaler Inhale 2 puffs into the lungs every 6 (six) hours as needed for wheezing or shortness of breath.    Marland Kitchen amLODipine (NORVASC) 5 MG tablet Take 1 tablet (5 mg total) by mouth daily. 90 tablet 3  . aspirin EC 81 MG tablet Take 81 mg by mouth daily.    . Continuous Blood Gluc Sensor (FREESTYLE LIBRE 14 DAY SENSOR) MISC 1 Units by Does not apply route every 14 (fourteen) days. 6 each 4  . escitalopram (LEXAPRO) 10 MG tablet Take 1 tablet (10 mg total) by mouth daily. 90 tablet 3  . esomeprazole (NEXIUM) 40 MG capsule Take 1 capsule (40 mg total) by mouth at bedtime. 90 capsule 3  . Insulin Glargine (BASAGLAR KWIKPEN) 100 UNIT/ML SOPN Inject 1.5 mLs (150 Units total) into the skin 2 (two) times daily. 270 mL 1  . insulin regular human CONCENTRATED (HUMULIN R U-500 KWIKPEN) 500 UNIT/ML kwikpen INJECT 120 UNITS BEFORE EACH MEAL. 36 pen 2  . isosorbide mononitrate (IMDUR) 30 MG 24 hr tablet Take 3 tablets (90 mg total) by mouth daily. TAKE THREE TABLETS BY MOUTH  IN THE AM 270 tablet 0  . lisinopril (PRINIVIL,ZESTRIL) 5 MG tablet Take 1 tablet (5 mg total) by mouth daily. 90 tablet 3  . metFORMIN (GLUCOPHAGE) 850 MG tablet Take 1 tablet (850 mg total) by mouth 3 (three) times daily. 270 tablet 2  . nitroGLYCERIN (NITROSTAT) 0.4 MG SL tablet Place 1 tablet (0.4 mg total) under the tongue every 5 (five) minutes as needed for  chest pain. 25 tablet 3  . OZEMPIC 0.25 or 0.5 MG/DOSE SOPN INJECT 0.5 MG INTO THE SKIN ONCE A WEEK. 1 pen 3   No current facility-administered medications for this visit.     Allergies: Levemir [insulin detemir]  Past Medical History:  Diagnosis Date  . Anxiety   . Asthma   . Barrett esophagus   . Coronary artery disease   . Depression   . Diabetes mellitus, type 2 ([place])   . Fistula    mouth  . GERD (gastroesophageal reflux disease)   . Heart attack ([place]) 10/2011  . Hiatal hernia   . History of Prinzmetal angina   . Obesity   . Pneumonia   . Prinzmetal angina ([place])   . Seasonal allergies   . Sleep apnea   . Stomach ulcer     Past Surgical History:  Procedure Laterality Date  . CARDIAC CATHETERIZATION  2002,2012  . CHOLECYSTECTOMY    . CLEFT LIP REPAIR    . CLEFT PLATE REPAIR     multiple surgeries  . ESOPHAGOGASTRODUODENOSCOPY    . FLEXIBLE SIGMOIDOSCOPY    . TONSILLECTOMY      Family History  Problem Relation Age of Onset  . Other Mother        borderline personality disorder  . Other Father        Dad unknown to patient  . Bipolar disorder Sister  1/2 sister  . Drug abuse Sister   . Diabetes Maternal Grandmother   . Dementia Maternal Grandmother   . Heart disease Maternal Grandfather     Social History   Tobacco Use  . Smoking status: Never Smoker  . Smokeless tobacco: Never Used  Substance Use Topics  . Alcohol use: No    Frequency: Never    Comment: RARE    Subjective:  Patient presents today as a new patient; under care of endocrine for his Type 2 Diabetes- last Hgba1c at 7.1; working with [facility] for Barretts/ needs to get re-scheduled for colonoscopy; overdue to see his cardiologist; history of hypertension, GERD; complaining of pain in his low back and knees/ concerned that he is developing some type of arthritis.   Agreeable to getting his flu shot updated today;   Objective:  Vitals:   08/20/18 0958  BP: 134/86  Pulse: 93   Temp: 98 F (36.7 C)  TempSrc: Oral  SpO2: 96%  Weight: (!) 360 lb (163.3 kg)  Height: '5\' 8"'  (1.727 m)    General: Well developed, well nourished, in no acute distress  Skin : Warm and dry.  Head: Normocephalic and atraumatic  Eyes: Sclera and conjunctiva clear; pupils round and reactive to light; extraocular movements intact Lungs: Respirations unlabored; clear to auscultation bilaterally without wheeze, rales, rhonchi  CVS exam: normal rate and regular rhythm.  Musculoskeletal: No deformities; no active joint inflammation  Extremities: No edema, cyanosis, clubbing  Vessels: Symmetric bilaterally  Neurologic: Alert and oriented; speech intact; face symmetrical; moves all extremities well; CNII-XII intact without focal deficit   Assessment:  1. Essential hypertension   2. History of MI (myocardial infarction)   3. History of Prinzmetal angina   4. Barrett's esophagus with dysplasia   5. Myalgia   6. Morbid obesity ([place])   7. Other specified anxiety disorders     Plan:  1. Stable; refills updated x 1 year; 2. Stressed need to see his cardiologist at least once per year; referral updated; 4. Referral back to GI- needs endoscopy and colonoscopy; 5. & 6.  Will update labs and X-rays to rule out arthritis source; suspect patient's weight is source of pain however; he notes he may be interested in seeing a weight loss specialist based on his lab results today; 7. Stable; refill updated on Lexapro x 1 year;    No follow-ups on file.  Orders Placed This Encounter  Procedures  . DG Lumbar Spine 2-3 Views    Standing Status:   Future    Number of Occurrences:   1    Standing Expiration Date:   10/21/2019    Order Specific Question:   Reason for Exam (SYMPTOM  OR DIAGNOSIS REQUIRED)    Answer:   low back pain    Order Specific Question:   Preferred imaging location?    Answer:   [facility]    Order Specific Question:   Radiology Contrast Protocol - do NOT remove file path     Answer:   \\[email or web address]\DXFluoroContrastProtocols.pdf  . DG Knee Complete 4 Views Left    Standing Status:   Future    Number of Occurrences:   1    Standing Expiration Date:   10/21/2019    Order Specific Question:   Reason for Exam (SYMPTOM  OR DIAGNOSIS REQUIRED)    Answer:   knee pain    Order Specific Question:   Preferred imaging location?    Answer:   [facility]  Order Specific Question:   Radiology Contrast Protocol - do NOT remove file path    Answer:   \\[email or web address]\Radiant\DXFluoroContrastProtocols.pdf  . DG Knee Complete 4 Views Right    Standing Status:   Future    Number of Occurrences:   1    Standing Expiration Date:   10/21/2019    Order Specific Question:   Reason for Exam (SYMPTOM  OR DIAGNOSIS REQUIRED)    Answer:   knee pain    Order Specific Question:   Preferred imaging location?    Answer:   [facility]    Order Specific Question:   Radiology Contrast Protocol - do NOT remove file path    Answer:   \\[email or web address]\Radiant\DXFluoroContrastProtocols.pdf  . Flu Vaccine QUAD 6+ mos PF IM (Fluarix Quad PF)  . Antinuclear Antib (ANA)    Standing Status:   Future    Number of Occurrences:   1    Standing Expiration Date:   08/20/2019  . Sed Rate (ESR)    Standing Status:   Future    Number of Occurrences:   1    Standing Expiration Date:   08/20/2019  . Rheumatoid Factor    Standing Status:   Future    Number of Occurrences:   1    Standing Expiration Date:   08/20/2019  . Vitamin D (25 hydroxy)    Standing Status:   Future    Number of Occurrences:   1    Standing Expiration Date:   08/20/2019  . Ambulatory referral to Cardiology    Referral Priority:   Routine    Referral Type:   Consultation    Referral Reason:   Specialty Services Required    Referred to Provider:   Josue Hector, MD    Requested Specialty:   Cardiology    Number of Visits Requested:   1  . Ambulatory referral to Gastroenterology    Referral  Priority:   Routine    Referral Type:   Consultation    Referral Reason:   Specialty Services Required    Referred to Provider:   Doran Stabler, MD    Number of Visits Requested:   1    Requested Prescriptions   Signed Prescriptions Disp Refills  . amLODipine (NORVASC) 5 MG tablet 90 tablet 3    Sig: Take 1 tablet (5 mg total) by mouth daily.  Marland Kitchen esomeprazole (NEXIUM) 40 MG capsule 90 capsule 3    Sig: Take 1 capsule (40 mg total) by mouth at bedtime.  Marland Kitchen lisinopril (PRINIVIL,ZESTRIL) 5 MG tablet 90 tablet 3    Sig: Take 1 tablet (5 mg total) by mouth daily.  . isosorbide mononitrate (IMDUR) 30 MG 24 hr tablet 270 tablet 0    Sig: Take 3 tablets (90 mg total) by mouth daily. TAKE THREE TABLETS BY MOUTH  IN THE AM  . escitalopram (LEXAPRO) 10 MG tablet 90 tablet 3    Sig: Take 1 tablet (10 mg total) by mouth daily.

## 2018-08-21 LAB — ANA: Anti Nuclear Antibody(ANA): NEGATIVE

## 2018-08-21 LAB — RHEUMATOID FACTOR: Rhuematoid fact SerPl-aCnc: <14 IU/mL (ref <14)

## 2018-08-23 NOTE — Telephone Encounter (Signed)
[id number]   Started prior authorization today via Cover My Meds. Currently pending approval.

## 2018-08-23 NOTE — Telephone Encounter (Signed)
Key: [id number]   Key for Nexium  Started today via cover my meds

## 2018-08-24 ENCOUNTER — Telehealth: Payer: Self-pay

## 2018-08-24 NOTE — Telephone Encounter (Signed)
Both meds approved today via cover my meds

## 2018-08-24 NOTE — Telephone Encounter (Signed)
Probably a combination of his weight and cardiac issues;

## 2018-08-24 NOTE — Telephone Encounter (Signed)
Spoke with patient and he would like to get your thoughts on what might be causing his SED rate to be so high.

## 2018-08-27 NOTE — Telephone Encounter (Signed)
Emailed patient info today.

## 2018-09-03 ENCOUNTER — Telehealth: Payer: Self-pay | Admitting: Gastroenterology

## 2018-09-05 NOTE — Telephone Encounter (Signed)
Please see if anyone waiting for a hospital procedure would like to take this open December slot.

## 2018-09-05 NOTE — Telephone Encounter (Signed)
FYI, left message for patient that I will cancel his procedure for [facility]  10/01/18. January schedule is not open yet, will put a recall in our system to contact him about rescheduling.

## 2018-09-14 ENCOUNTER — Encounter: Payer: Self-pay | Admitting: Internal Medicine

## 2018-09-14 ENCOUNTER — Ambulatory Visit: Payer: 59 | Admitting: Internal Medicine

## 2018-09-14 VITALS — BP 138/72 | HR 90 | Temp 97.7°F | Resp 20 | Ht 68.0 in | Wt 361.8 lb

## 2018-09-14 DIAGNOSIS — J0100 Acute maxillary sinusitis, unspecified: Secondary | ICD-10-CM | POA: Insufficient documentation

## 2018-09-14 MED ORDER — HYDROCODONE-HOMATROPINE 5-1.5 MG/5ML PO SYRP: 5.0000 mL | ORAL_SOLUTION | Freq: Three times a day (TID) | ORAL | 0 refills | Status: DC | PRN

## 2018-09-14 MED ORDER — AMOXICILLIN-POT CLAVULANATE 875-125 MG PO TABS: 1.0000 | ORAL_TABLET | Freq: Two times a day (BID) | ORAL | 0 refills | Status: DC

## 2018-09-14 NOTE — Progress Notes (Signed)
Subjective:    Patient ID: Henry Vasquez, male    DOB: 09-Oct-1966, 52 y.o.   MRN: [id number]  HPI He is here for an acute visit for cold symptoms.  His symptoms started yesterday.  He had sick contacts in the past several days.    He is experiencing chills, nasal congestion, sinus pain, sore throat, productive cough, wheezing, nausea, myalgias and headaches.  He denies any known fever, ear pain, shortness of breath and diarrhea.  He has not noticed any lightheadedness/dizziness.  He has tried taking Tylenol.   He is high risk of sinus infection - surgeries in past.  He continues to have some sinus issues and it has been suggested that he have surgery, but he wants to hold off.  He is concerned this will go into a severe sinus infection.   Medications and allergies reviewed with patient and updated if appropriate.  Patient Active Problem List   Diagnosis Date Noted  . Morbid obesity ([facility]) 11/17/2017  . Uncontrolled type 2 diabetes mellitus with hyperglycemia, with long-term current use of insulin ([facility]) 08/07/2017  . Gastroesophageal reflux disease with esophagitis 11/04/2016  . History of MI (myocardial infarction) 11/04/2016  . Other specified anxiety disorders 11/04/2016    Current Outpatient Medications on File Prior to Visit  Medication Sig Dispense Refill  . albuterol (PROVENTIL HFA;VENTOLIN HFA) 108 (90 Base) MCG/ACT inhaler Inhale 2 puffs into the lungs every 6 (six) hours as needed for wheezing or shortness of breath.    Marland Kitchen. amLODipine (NORVASC) 5 MG tablet Take 1 tablet (5 mg total) by mouth daily. 90 tablet 3  . aspirin EC 81 MG tablet Take 81 mg by mouth daily.    . Continuous Blood Gluc Sensor (FREESTYLE LIBRE 14 DAY SENSOR) MISC 1 Units by Does not apply route every 14 (fourteen) days. 6 each 4  . escitalopram (LEXAPRO) 10 MG tablet Take 1 tablet (10 mg total) by mouth daily. 90 tablet 3  . esomeprazole (NEXIUM) 40 MG capsule Take 1 capsule (40 mg total) by mouth at  bedtime. 90 capsule 3  . Insulin Glargine (BASAGLAR KWIKPEN) 100 UNIT/ML SOPN Inject 1.5 mLs (150 Units total) into the skin 2 (two) times daily. 270 mL 1  . insulin regular human CONCENTRATED (HUMULIN R U-500 KWIKPEN) 500 UNIT/ML kwikpen INJECT 120 UNITS BEFORE EACH MEAL. 36 pen 2  . isosorbide mononitrate (IMDUR) 30 MG 24 hr tablet Take 3 tablets (90 mg total) by mouth daily. TAKE THREE TABLETS BY MOUTH  IN THE AM 270 tablet 0  . lisinopril (PRINIVIL,ZESTRIL) 5 MG tablet Take 1 tablet (5 mg total) by mouth daily. 90 tablet 3  . metFORMIN (GLUCOPHAGE) 850 MG tablet Take 1 tablet (850 mg total) by mouth 3 (three) times daily. 270 tablet 2  . nitroGLYCERIN (NITROSTAT) 0.4 MG SL tablet Place 1 tablet (0.4 mg total) under the tongue every 5 (five) minutes as needed for chest pain. 25 tablet 3  . OZEMPIC 0.25 or 0.5 MG/DOSE SOPN INJECT 0.5 MG INTO THE SKIN ONCE A WEEK. 1 pen 3   No current facility-administered medications on file prior to visit.     Past Medical History:  Diagnosis Date  . Anxiety   . Asthma   . Barrett esophagus   . Coronary artery disease   . Depression   . Diabetes mellitus, type 2 ([facility])   . Fistula    mouth  . GERD (gastroesophageal reflux disease)   . Heart attack ([facility]) 10/2011  .  Hiatal hernia   . History of Prinzmetal angina   . Obesity   . Pneumonia   . Prinzmetal angina ([facility])   . Seasonal allergies   . Sleep apnea   . Stomach ulcer     Past Surgical History:  Procedure Laterality Date  . CARDIAC CATHETERIZATION  2002,2012  . CHOLECYSTECTOMY    . CLEFT LIP REPAIR    . CLEFT PLATE REPAIR     multiple surgeries  . ESOPHAGOGASTRODUODENOSCOPY    . FLEXIBLE SIGMOIDOSCOPY    . TONSILLECTOMY      Social History   Socioeconomic History  . Marital status: Married    Spouse name: Kennith Center  . Number of children: 0  . Years of education: Not on file  . Highest education level: Not on file  Occupational History  . Occupation: Child psychotherapist  Social Needs    . Financial resource strain: Not on file  . Food insecurity:    Worry: Not on file    Inability: Not on file  . Transportation needs:    Medical: Not on file    Non-medical: Not on file  Tobacco Use  . Smoking status: Never Smoker  . Smokeless tobacco: Never Used  Substance and Sexual Activity  . Alcohol use: No    Frequency: Never    Comment: RARE  . Drug use: No  . Sexual activity: Yes    Comment: MARRIED  Lifestyle  . Physical activity:    Days per week: Not on file    Minutes per session: Not on file  . Stress: Not on file  Relationships  . Social connections:    Talks on phone: Not on file    Gets together: Not on file    Attends religious service: Not on file    Active member of club or organization: Not on file    Attends meetings of clubs or organizations: Not on file    Relationship status: Not on file  Other Topics Concern  . Not on file  Social History Narrative  . Not on file    Family History  Problem Relation Age of Onset  . Other Mother        borderline personality disorder  . Other Father        Dad unknown to patient  . Bipolar disorder Sister        1/2 sister  . Drug abuse Sister   . Diabetes Maternal Grandmother   . Dementia Maternal Grandmother   . Heart disease Maternal Grandfather     Review of Systems  Constitutional: Positive for chills (sometimes). Negative for fever.  HENT: Positive for congestion, sinus pain and sore throat. Negative for ear pain.   Respiratory: Positive for cough (some cough) and wheezing. Negative for shortness of breath.   Gastrointestinal: Positive for nausea. Negative for diarrhea.  Musculoskeletal: Positive for myalgias.  Neurological: Positive for headaches. Negative for dizziness and light-headedness.       Objective:   Vitals:   09/14/18 0955  BP: 138/72  Pulse: 90  Resp: 20  Temp: 97.7 F (36.5 C)  SpO2: 98%   Filed Weights   09/14/18 0955  Weight: (!) 361 lb 12.8 oz (164.1 kg)   Body  mass index is 55.01 kg/m.  Wt Readings from Last 3 Encounters:  09/14/18 (!) 361 lb 12.8 oz (164.1 kg)  08/20/18 (!) 360 lb (163.3 kg)  07/04/18 (!) 357 lb 12.8 oz (162.3 kg)     Physical Exam GENERAL APPEARANCE:  Appears stated age, well appearing, NAD EYES: conjunctiva clear, no icterus HEENT: bilateral tympanic membranes and ear canals normal, oropharynx with mild erythema, bilateral maxillary sinus tenderness, no thyromegaly, trachea midline, no cervical or supraclavicular lymphadenopathy LUNGS: Clear to auscultation without wheeze or crackles, unlabored breathing, good air entry bilaterally CARDIOVASCULAR: Normal S1,S2 without murmurs, no edema SKIN: warm, dry        Assessment & Plan:   See Problem List for Assessment and Plan of chronic medical problems.

## 2018-09-14 NOTE — Assessment & Plan Note (Signed)
He is very early in the infection and I discussed with him that this could possibly be viral in an antibiotic is not necessary, but given his significant sinus history I will start him on Augmentin twice daily x10 days Hycodan cough syrup for nighttime Over-the-counter cold medications as needed Rest, fluids Call if no improvement

## 2018-09-14 NOTE — Patient Instructions (Addendum)
A note was written for work.    Take the antibiotic and cough syrup as needed.    Take over the counter cold medications as needed.    Increase rest and fluids.    Call if no improvement

## 2018-09-30 ENCOUNTER — Other Ambulatory Visit: Payer: Self-pay | Admitting: Endocrinology

## 2018-09-30 DIAGNOSIS — E1165 Type 2 diabetes mellitus with hyperglycemia: Secondary | ICD-10-CM

## 2018-09-30 DIAGNOSIS — Z794 Long term (current) use of insulin: Principal | ICD-10-CM

## 2018-10-01 ENCOUNTER — Ambulatory Visit (HOSPITAL_COMMUNITY): Admit: 2018-10-01 | Payer: Managed Care, Other (non HMO) | Admitting: Gastroenterology

## 2018-10-01 ENCOUNTER — Encounter (HOSPITAL_COMMUNITY): Payer: Self-pay

## 2018-10-01 SURGERY — COLONOSCOPY WITH PROPOFOL
Anesthesia: Monitor Anesthesia Care

## 2018-10-02 ENCOUNTER — Other Ambulatory Visit (INDEPENDENT_AMBULATORY_CARE_PROVIDER_SITE_OTHER): Payer: 59

## 2018-10-02 DIAGNOSIS — E1165 Type 2 diabetes mellitus with hyperglycemia: Secondary | ICD-10-CM

## 2018-10-02 DIAGNOSIS — Z794 Long term (current) use of insulin: Secondary | ICD-10-CM | POA: Diagnosis not present

## 2018-10-02 LAB — BASIC METABOLIC PANEL
BUN: 14 mg/dL (ref 6–23)
CALCIUM: 9.3 mg/dL (ref 8.4–10.5)
CO2: 27 meq/L (ref 19–32)
CREATININE: 0.97 mg/dL (ref 0.40–1.50)
Chloride: 99 mEq/L (ref 96–112)
GFR: 85.95 mL/min (ref >60.00)
GLUCOSE: 180 mg/dL — AB (ref 70–99)
Potassium: 3.9 mEq/L (ref 3.5–5.1)
Sodium: 135 mEq/L (ref 135–145)

## 2018-10-02 LAB — HEMOGLOBIN A1C: Hgb A1c MFr Bld: 7.5 % — ABNORMAL HIGH (ref 4.6–6.5)

## 2018-10-05 ENCOUNTER — Ambulatory Visit: Payer: BLUE CROSS/BLUE SHIELD | Admitting: Endocrinology

## 2018-10-05 ENCOUNTER — Encounter: Payer: Self-pay | Admitting: Endocrinology

## 2018-10-05 ENCOUNTER — Ambulatory Visit: Payer: 59 | Admitting: Endocrinology

## 2018-10-05 ENCOUNTER — Other Ambulatory Visit: Payer: Self-pay

## 2018-10-05 VITALS — BP 138/58 | HR 101 | Ht 68.0 in | Wt 375.4 lb

## 2018-10-05 DIAGNOSIS — E1165 Type 2 diabetes mellitus with hyperglycemia: Secondary | ICD-10-CM | POA: Diagnosis not present

## 2018-10-05 DIAGNOSIS — Z794 Long term (current) use of insulin: Secondary | ICD-10-CM | POA: Diagnosis not present

## 2018-10-05 MED ORDER — SEMAGLUTIDE 7 MG PO TABS: 1.0000 | ORAL_TABLET | Freq: Every day | ORAL | 11 refills | Status: DC

## 2018-10-05 NOTE — Patient Instructions (Signed)
Walk more often  Humulin R before supper 90-100 and 100 Basaglar

## 2018-10-05 NOTE — Progress Notes (Signed)
Patient ID: Henry Vasquez, male   DOB: [birth year], 52 y.o.   MRN: [id number]          Reason for Appointment: Follow-up for Type 2 Diabetes  Referring physician: Rankins   History of Present Illness:          Date of diagnosis of type 2 diabetes mellitus: 2004        Background history:    At initial diagnosis he had symptoms of increased thirst, confusion and increased blood sugar He was treated mostly with metformin and possibly Avandia, no detailed history isn't available He started taking insulin about 5 years later and metformin has been continued He is new to the area as of 2018 and previous records not available He thinks he was seen by an endocrinologist about 7 years ago but not regularly and has been managed mostly by PCPs  Recent history:   INSULIN regimen is:   Humulin R U-500: Variable units tid , Basaglar 150 units-a.m., 100-150 units p.m.      Non-insulin hypoglycemic drugs the patient is taking are: Metformin 850mg  3 times a day, not on Ozempic 0.5 mg weekly   His A1c is increased at 7.5 compared to 7.0 previously  Current management, blood sugar patterns and problems identified:  He has not taken his Ozempic since about October because of high out-of-pocket expense  He thinks his blood sugars are higher because of this  Also has gained back significant amount of weight  He says he is taking as much as 100 units of Humulin R at suppertime because his blood sugars go up  However he is also going up as much as 50 units on his Basaglar in the evening despite the fact that he has low normal or low sugars fasting at times  Also has not done blood sugar testing with the freestyle libre for the last 10 days because of the sensor been too expensive  With his current regimen his blood sugars are the best overnight Highest between 8-10 PM as of last month  Postprandial readings can be over 300 at times especially recently  Because of his joint pains he is not able  to exercise much and not doing a thing formal        Side effects from medications have been: None  Compliance with the medical regimen: Fair Hypoglycemia: None    Glucose monitoring:       Glucometer:  Freestyle libre   CONTINUOUS GLUCOSE MONITORING RECORD INTERPRETATION    Dates of Recording: 11/13 through 11/26  Results statistics:   CGM use % of time  92  Average and SD 149, was 154  Time in range  60     %  % Time Above 180  31  % Time above 250  5  % Time Below target  9    PRE-MEAL Fasting Lunch Dinner Bedtime Overall  Glucose range:       Mean/median:  111  132  184  184    POST-MEAL PC Breakfast PC Lunch PC Dinner  Glucose range:     Mean/median:  140  151  204     PRE-MEAL Fasting Lunch Dinner Bedtime Overall  Glucose range:       Mean/median:  139  182  146  136 154   POST-MEAL PC Breakfast PC Lunch PC Dinner  Glucose range:     Mean/median:  193  173  166    Self-care: The diet that the patient has  been following is: tries to limit sugars       Meal times are:  Breakfast is at 7 AM Lunch: 2 PM Dinner: 6:30 PM    Typical meal intake: Breakfast is Biscuits  Snacks may be small candy, chips                Dietician visit, most recent:2004               Exercise: a little walking with dog  Weight history:  Wt Readings from Last 3 Encounters:  10/05/18 (!) 375 lb 6.4 oz (170.3 kg)  09/14/18 (!) 361 lb 12.8 oz (164.1 kg)  08/20/18 (!) 360 lb (163.3 kg)    Glycemic control:   Lab Results  Component Value Date   HGBA1C 7.5 (H) 10/02/2018   HGBA1C 7.0 (H) 08/20/2018   HGBA1C 7.2 (H) 06/29/2018    Last microalbumin/creatinine ratio was 35.2 in 03/2017  Lab Results  Component Value Date   MICROALBUR 2.8 (H) 03/30/2018   LDLCALC 68 03/30/2018   CREATININE 0.97 10/02/2018   Lab Results  Component Value Date   MICRALBCREAT 2.5 03/30/2018    Lab Results  Component Value Date   FRUCTOSAMINE 212 09/14/2017      Allergies as of  10/05/2018      Reactions   Levemir [insulin Detemir] Other (See Comments)   Severe pain around site of the injection, redness      Medication List        Accurate as of 10/05/18  2:46 PM. Always use your most recent med list.          albuterol 108 (90 Base) MCG/ACT inhaler Commonly known as:  PROVENTIL HFA;VENTOLIN HFA Inhale 2 puffs into the lungs every 6 (six) hours as needed for wheezing or shortness of breath.   amLODipine 5 MG tablet Commonly known as:  NORVASC Take 1 tablet (5 mg total) by mouth daily.   aspirin EC 81 MG tablet Take 81 mg by mouth daily.   BASAGLAR KWIKPEN 100 UNIT/ML Sopn Inject 1.5 mLs (150 Units total) into the skin 2 (two) times daily.   escitalopram 10 MG tablet Commonly known as:  LEXAPRO Take 1 tablet (10 mg total) by mouth daily.   esomeprazole 40 MG capsule Commonly known as:  NEXIUM Take 1 capsule (40 mg total) by mouth at bedtime.   insulin regular human CONCENTRATED 500 UNIT/ML kwikpen Commonly known as:  HUMULIN R INJECT 120 UNITS BEFORE EACH MEAL.   isosorbide mononitrate 30 MG 24 hr tablet Commonly known as:  IMDUR Take 3 tablets (90 mg total) by mouth daily. TAKE THREE TABLETS BY MOUTH  IN THE AM   lisinopril 5 MG tablet Commonly known as:  PRINIVIL,ZESTRIL Take 1 tablet (5 mg total) by mouth daily.   metFORMIN 850 MG tablet Commonly known as:  GLUCOPHAGE Take 1 tablet (850 mg total) by mouth 3 (three) times daily.   nitroGLYCERIN 0.4 MG SL tablet Commonly known as:  NITROSTAT Place 1 tablet (0.4 mg total) under the tongue every 5 (five) minutes as needed for chest pain.   Semaglutide 7 MG Tabs Take 1 tablet by mouth daily. TAKE 1 TABLET BY MOUTH ONCE DAILY.       Allergies:  Allergies  Allergen Reactions  . Levemir [Insulin Detemir] Other (See Comments)    Severe pain around site of the injection, redness    Past Medical History:  Diagnosis Date  . Anxiety   . Asthma   .  Barrett esophagus   . Coronary  artery disease   . Depression   . Diabetes mellitus, type 2 (HCC)   . Fistula    mouth  . GERD (gastroesophageal reflux disease)   . Heart attack (HCC) 10/2011  . Hiatal hernia   . History of Prinzmetal angina   . Obesity   . Pneumonia   . Prinzmetal angina (HCC)   . Seasonal allergies   . Sleep apnea   . Stomach ulcer     Past Surgical History:  Procedure Laterality Date  . CARDIAC CATHETERIZATION  2002,2012  . CHOLECYSTECTOMY    . CLEFT LIP REPAIR    . CLEFT PLATE REPAIR     multiple surgeries  . ESOPHAGOGASTRODUODENOSCOPY    . FLEXIBLE SIGMOIDOSCOPY    . TONSILLECTOMY      Family History  Problem Relation Age of Onset  . Other Mother        borderline personality disorder  . Other Father        Dad unknown to patient  . Bipolar disorder Sister        1/2 sister  . Drug abuse Sister   . Diabetes Maternal Grandmother   . Dementia Maternal Grandmother   . Heart disease Maternal Grandfather     Social History:  reports that he has never smoked. He has never used smokeless tobacco. He reports that he does not drink alcohol or use drugs.   Review of Systems   Lipid history:  His last LDL was 68  Not on statin drugs    Lab Results  Component Value Date   CHOL 128 03/30/2018   HDL 34.90 (L) 03/30/2018   LDLCALC 68 03/30/2018   TRIG 126.0 03/30/2018   CHOLHDL 4 03/30/2018           Hypertension:Treated with amlodipine and lisinopril 5 mg by his PCP   Most recent foot exam: 07/2017   Physical Examination:  BP (!) 138/58 (BP Location: Left Arm, Patient Position: Sitting, Cuff Size: Normal)   Pulse (!) 101   Ht 5\' 8"  (1.727 m)   Wt (!) 375 lb 6.4 oz (170.3 kg)   SpO2 93%   BMI 57.08 kg/m            ASSESSMENT:  Diabetes type 2, with morbid obesity     See history of present illness for detailed discussion of current diabetes management, blood sugar patterns and problems identified  His A1c is 7.5  His blood sugars are getting higher  especially recently This is likely to be from not taking Ozempic He is again taking very large doses of insulin including basal insulin and Humulin R  As discussed above he is not really knowing how to adjust his insulin and with taking large doses of basal insulin in the evening he is getting overnight hypoglycemia Blood sugars are still not well controlled after evening meal because of insulin resistance and likely not controlling carbohydrates as well without the Ozempic He has difficulty affording Ozempic and the freestyle libre currently and does not want to try using these   PLAN:    Discussed how the mealtime insulin differs from basal insulin and which 1 to adjust to control hyperglycemia after meals versus overnight  Discussed need to avoid hypoglycemia especially with his tendency to weight gain  Since he cannot afford Ozempic he will start Rybelsus 3 mg daily  Discussed exactly how to take this before breakfast and he can take the sample for 30 days  or less and then go up to 7 mg  He will reduce his evening basal glargine back to 100  He will not increase the Basaglar unless the morning sugars are significantly high  To continue increasing the evening regular insulin up to 100 units but may not need it once Rybelsus is effective  Given him a discount card for the contour next test strips which he will start using  Discussed blood sugar targets at various times  Encouraged him to be more active  Patient Instructions  Walk more often  Humulin R before supper 90-100 and 100 Basaglar      Counseling time on subjects discussed in assessment and plan sections is over 50% of today's 25 minute visit    Reather Littler 10/05/2018, 2:46 PM   Note: This office note was prepared with Dragon voice recognition system technology. Any transcriptional errors that result from this process are unintentional.

## 2018-10-08 ENCOUNTER — Ambulatory Visit (INDEPENDENT_AMBULATORY_CARE_PROVIDER_SITE_OTHER): Payer: Worker's Compensation

## 2018-10-08 ENCOUNTER — Other Ambulatory Visit: Payer: Self-pay

## 2018-10-08 ENCOUNTER — Encounter (HOSPITAL_COMMUNITY): Payer: Self-pay | Admitting: Emergency Medicine

## 2018-10-08 ENCOUNTER — Ambulatory Visit (HOSPITAL_COMMUNITY)
Admission: EM | Admit: 2018-10-08 | Discharge: 2018-10-08 | Disposition: A | Discharge status: Home / Self Care | Payer: Worker's Compensation | Attending: Emergency Medicine | Admitting: Emergency Medicine

## 2018-10-08 DIAGNOSIS — M79641 Pain in right hand: Secondary | ICD-10-CM

## 2018-10-08 DIAGNOSIS — M7989 Other specified soft tissue disorders: Secondary | ICD-10-CM

## 2018-10-08 NOTE — ED Triage Notes (Signed)
Tripped on curbing, caught self with right hand.  Patient having pain in right wrist.  Now having generalized aches and pain.

## 2018-10-08 NOTE — ED Provider Notes (Signed)
[facility]    CSN: [id number] Arrival date & time: 10/08/18  1713     History   Chief Complaint Chief Complaint  Patient presents with  . Wrist Pain    HPI Henry Vasquez is a 52 y.o. male.   Fell while at work caught self with rt hand has pain to lateral side of small finger. Able to move hand and make a fist but has some swelling to pinky finger. Did not take anything pta arrive this happened approx 1 hour ago      Past Medical History:  Diagnosis Date  . Anxiety   . Asthma   . Barrett esophagus   . Coronary artery disease   . Depression   . Diabetes mellitus, type 2 (HCC)   . Fistula    mouth  . GERD (gastroesophageal reflux disease)   . Heart attack (HCC) 10/2011  . Hiatal hernia   . History of Prinzmetal angina   . Obesity   . Pneumonia   . Prinzmetal angina (HCC)   . Seasonal allergies   . Sleep apnea   . Stomach ulcer     Patient Active Problem List   Diagnosis Date Noted  . Acute non-recurrent maxillary sinusitis 09/14/2018  . Morbid obesity (HCC) 11/17/2017  . Uncontrolled type 2 diabetes mellitus with hyperglycemia, with long-term current use of insulin (HCC) 08/07/2017  . Gastroesophageal reflux disease with esophagitis 11/04/2016  . History of MI (myocardial infarction) 11/04/2016  . Other specified anxiety disorders 11/04/2016    Past Surgical History:  Procedure Laterality Date  . CARDIAC CATHETERIZATION  2002,2012  . CHOLECYSTECTOMY    . CLEFT LIP REPAIR    . CLEFT PLATE REPAIR     multiple surgeries  . ESOPHAGOGASTRODUODENOSCOPY    . FLEXIBLE SIGMOIDOSCOPY    . TONSILLECTOMY         Home Medications    Prior to Admission medications   Medication Sig Start Date End Date Taking? Authorizing Provider  albuterol (PROVENTIL HFA;VENTOLIN HFA) 108 (90 Base) MCG/ACT inhaler Inhale 2 puffs into the lungs every 6 (six) hours as needed for wheezing or shortness of breath.    [provider]  amLODipine (NORVASC) 5  MG tablet Take 1 tablet (5 mg total) by mouth daily. 08/20/18   [name], FNP  aspirin EC 81 MG tablet Take 81 mg by mouth daily.    [provider]  escitalopram (LEXAPRO) 10 MG tablet Take 1 tablet (10 mg total) by mouth daily. 08/20/18   [name], FNP  esomeprazole (NEXIUM) 40 MG capsule Take 1 capsule (40 mg total) by mouth at bedtime. 08/20/18   [name], FNP  Insulin Glargine (BASAGLAR KWIKPEN) 100 UNIT/ML SOPN Inject 1.5 mLs (150 Units total) into the skin 2 (two) times daily. 01/23/18   Reather Littler, MD  insulin regular human CONCENTRATED (HUMULIN R U-500 KWIKPEN) 500 UNIT/ML kwikpen INJECT 120 UNITS BEFORE EACH MEAL. 12/22/17   Reather Littler, MD  isosorbide mononitrate (IMDUR) 30 MG 24 hr tablet Take 3 tablets (90 mg total) by mouth daily. TAKE THREE TABLETS BY MOUTH  IN THE AM 08/20/18   [name], FNP  lisinopril (PRINIVIL,ZESTRIL) 5 MG tablet Take 1 tablet (5 mg total) by mouth daily. 08/20/18   [name], FNP  metFORMIN (GLUCOPHAGE) 850 MG tablet Take 1 tablet (850 mg total) by mouth 3 (three) times daily. 01/08/18   Reather Littler, MD  nitroGLYCERIN (NITROSTAT) 0.4 MG SL tablet Place 1  tablet (0.4 mg total) under the tongue every 5 (five) minutes as needed for chest pain. 04/20/17   Wendall Stade, MD  Semaglutide (RYBELSUS) 7 MG TABS Take 1 tablet by mouth daily. TAKE 1 TABLET BY MOUTH ONCE DAILY. 10/05/18   Reather Littler, MD    Family History Family History  Problem Relation Age of Onset  . Other Mother        borderline personality disorder  . Other Father        Dad unknown to patient  . Bipolar disorder Sister        1/2 sister  . Drug abuse Sister   . Diabetes Maternal Grandmother   . Dementia Maternal Grandmother   . Heart disease Maternal Grandfather     Social History Social History   Tobacco Use  . Smoking status: Never Smoker  . Smokeless tobacco: Never Used  Substance Use Topics  . Alcohol  use: No    Frequency: Never    Comment: RARE  . Drug use: No     Allergies   Levemir [insulin detemir]   Review of Systems Review of Systems  Constitutional: Negative.   Respiratory: Negative.   Cardiovascular: Negative.   Musculoskeletal: Positive for joint swelling.  Skin:       Swelling to RT side of pinky finger and hand   Neurological: Negative.      Physical Exam Triage Vital Signs ED Triage Vitals  Enc Vitals Group     BP 10/08/18 1912 135/63     Pulse Rate 10/08/18 1912 96     Resp 10/08/18 1912 (!) 24     Temp 10/08/18 1912 97.6 F (36.4 C)     Temp Source 10/08/18 1912 Oral     SpO2 10/08/18 1912 98 %     Weight --      Height --      Head Circumference --      Peak Flow --      Pain Score 10/08/18 1909 7     Pain Loc --      Pain Edu? --      Excl. in GC? --    No data found.  Updated Vital Signs BP 135/63 (BP Location: Left Arm) Comment (BP Location): large cuff  Pulse 96   Temp 97.6 F (36.4 C) (Oral)   Resp (!) 24   SpO2 98%   Visual Acuity Right Eye Distance:   Left Eye Distance:   Bilateral Distance:    Right Eye Near:   Left Eye Near:    Bilateral Near:     Physical Exam  Constitutional: He appears well-developed.  Cardiovascular: Normal rate and regular rhythm.  Pulmonary/Chest: Effort normal.  Neurological: He is alert.  Skin: Capillary refill takes less than 2 seconds.  +1 edema noted to rt lateral of small finger . Full ROM , strong pulses      UC Treatments / Results  Labs (all labs ordered are listed, but only abnormal results are displayed) Labs Reviewed - No data to display  EKG None  Radiology Dg Wrist Complete Right  Result Date: 10/08/2018 CLINICAL DATA:  52 year old male status post fall today with lateral wrist pain. EXAM: RIGHT WRIST - COMPLETE 3+ VIEW COMPARISON:  None. FINDINGS: Chronic appearing deformity of the right ulnar styloid. Generalized wrist soft tissue swelling. Distal radius appears intact  with normal carpal bone alignment and joint spaces. Metacarpals appear intact. IMPRESSION: Soft tissue swelling. No acute fracture or dislocation identified about  the right wrist. Electronically Signed   By: Odessa FlemingH  Hall M.D.   On: 10/08/2018 19:49    Procedures Procedures (including critical care time)  Medications Ordered in UC Medications - No data to display  Initial Impression / Assessment and Plan / UC Course  I have reviewed the triage vital signs and the nursing notes.  Pertinent labs & imaging results that were available during my care of the patient were reviewed by me and considered in my medical decision making (see chart for details).    my use ice and heat to are  Use NSAIDS as needed for pain  Wear ace for 3 days and symptoms not better in 5 days call ortho for further test  X ray was negative for any fracture    Final Clinical Impressions(s) / UC Diagnoses   Final diagnoses:  Pain of right hand  Swelling of limb     Discharge Instructions     my use ice and heat to are  Use NSAIDS as needed for pain  Wear ace for 3 days and symptoms not better in 5 days call ortho for further test  X ray was negative for any fracture      ED Prescriptions    None     Controlled Substance Prescriptions [place] Controlled Substance Registry consulted? Not Applicable   Coralyn MarkMitchell, Melanie L, NP 10/08/18 2006

## 2018-10-08 NOTE — Discharge Instructions (Addendum)
my use ice and heat to are  Use NSAIDS as needed for pain  Wear ace for 3 days and symptoms not better in 5 days call ortho for further test  X ray was negative for any fracture

## 2018-10-09 ENCOUNTER — Other Ambulatory Visit: Payer: Self-pay

## 2018-10-09 ENCOUNTER — Telehealth: Payer: Self-pay | Admitting: Endocrinology

## 2018-10-09 MED ORDER — INSULIN REGULAR HUMAN (CONC) 500 UNIT/ML ~~LOC~~ SOPN: PEN_INJECTOR | SUBCUTANEOUS | 1 refills | Status: DC

## 2018-10-09 MED ORDER — BASAGLAR KWIKPEN 100 UNIT/ML ~~LOC~~ SOPN: 150.0000 [IU] | PEN_INJECTOR | Freq: Two times a day (BID) | SUBCUTANEOUS | 1 refills | Status: DC

## 2018-10-09 NOTE — Telephone Encounter (Signed)
Patient stated that his mail order pharmacy will be faxing over some information on his prescriptions that were going to be sent yesterday.    insulin regular human CONCENTRATED (HUMULIN R U-500 KWIKPEN) 500 UNIT/ML kwikpen   Insulin Glargine (BASAGLAR KWIKPEN) 100 UNIT/ML SOPN   [facility] [place] [address]

## 2018-10-09 NOTE — Telephone Encounter (Signed)
rx sent

## 2018-10-17 ENCOUNTER — Telehealth: Payer: Self-pay | Admitting: Endocrinology

## 2018-10-17 NOTE — Telephone Encounter (Signed)
error 

## 2018-10-18 ENCOUNTER — Ambulatory Visit: Payer: Managed Care, Other (non HMO) | Admitting: Cardiovascular Disease

## 2018-11-05 NOTE — Progress Notes (Signed)
Cardiology Office Note   Date:  11/06/2018   ID:  Henry Vasquez, DOB 1966/03/26, MRN [id number]  PCP:  Olive BassMurray, Laura Woodruff, FNP  Cardiologist:   Charlton HawsPeter Rj Pedrosa, MD     No chief complaint on file.     History of Present Illness: Henry LusterRobert Vasquez is a 53 y.o. male who presents for f/u of CAD and history of MI in 2012. Referred by Dr Crista CurbVejcik [facility].  CRF include IDDM, HTN elevated lipids History of GERD and hiatal hernia Notes in CareEveryWhere indicated prinzmetal angina MI when he missed his meds for 2 weeks Had cardiac cath in 2002 and 2012 First had SSCP in 2008 cath has shown no epicardial CAD only spasm 2012 had SSCP after not taking amlodipine and imdur cath at that time showed no spasm or CAD  [place]   Reviewed office notes Dr Barbaraann Barthelankins. She reported some Atypical pains left costosternal margin reproducible with palpation   Baseline lipids not bad and feels worse with muscle pains on statin so stopped   TTE 05/04/17 reviewed EF 65-70% no significant valve disease   Wife got a job in [place] and they will be moving there He is still working in mental health Long discussion about need for bariatric surgery. Does not exercise and diet still poor With A1c 7.3   Past Medical History:  Diagnosis Date  . Anxiety   . Asthma   . Barrett esophagus   . Coronary artery disease   . Depression   . Diabetes mellitus, type 2 (HCC)   . Fistula    mouth  . GERD (gastroesophageal reflux disease)   . Heart attack (HCC) 10/2011  . Hiatal hernia   . History of Prinzmetal angina   . Obesity   . Pneumonia   . Prinzmetal angina (HCC)   . Seasonal allergies   . Sleep apnea   . Stomach ulcer     Past Surgical History:  Procedure Laterality Date  . CARDIAC CATHETERIZATION  2002,2012  . CHOLECYSTECTOMY    . CLEFT LIP REPAIR    . CLEFT PLATE REPAIR     multiple surgeries  . ESOPHAGOGASTRODUODENOSCOPY    . FLEXIBLE SIGMOIDOSCOPY    . TONSILLECTOMY         Current Outpatient Medications  Medication Sig Dispense Refill  . albuterol (PROVENTIL HFA;VENTOLIN HFA) 108 (90 Base) MCG/ACT inhaler Inhale 2 puffs into the lungs every 6 (six) hours as needed for wheezing or shortness of breath.    Marland Kitchen. amLODipine (NORVASC) 5 MG tablet Take 1 tablet (5 mg total) by mouth daily. 90 tablet 3  . aspirin EC 81 MG tablet Take 81 mg by mouth daily.    Marland Kitchen. escitalopram (LEXAPRO) 10 MG tablet Take 1 tablet (10 mg total) by mouth daily. 90 tablet 3  . esomeprazole (NEXIUM) 40 MG capsule Take 1 capsule (40 mg total) by mouth at bedtime. 90 capsule 3  . Insulin Glargine (BASAGLAR KWIKPEN) 100 UNIT/ML SOPN Inject 1.5 mLs (150 Units total) into the skin 2 (two) times daily. 90 pen 1  . insulin regular human CONCENTRATED (HUMULIN R U-500 KWIKPEN) 500 UNIT/ML kwikpen INJECT 120 UNITS BEFORE EACH MEAL. 8 pen 1  . isosorbide mononitrate (IMDUR) 30 MG 24 hr tablet Take 3 tablets (90 mg total) by mouth daily. TAKE THREE TABLETS BY MOUTH  IN THE AM 270 tablet 0  . lisinopril (PRINIVIL,ZESTRIL) 5 MG tablet Take 1 tablet (5 mg total) by mouth daily. 90  tablet 3  . metFORMIN (GLUCOPHAGE) 850 MG tablet Take 1 tablet (850 mg total) by mouth 3 (three) times daily. 270 tablet 2  . nitroGLYCERIN (NITROSTAT) 0.4 MG SL tablet Place 1 tablet (0.4 mg total) under the tongue every 5 (five) minutes as needed for chest pain. 25 tablet 3  . Semaglutide (RYBELSUS) 7 MG TABS Take 1 tablet by mouth daily. TAKE 1 TABLET BY MOUTH ONCE DAILY. 30 tablet 11   No current facility-administered medications for this visit.     Allergies:   Levemir [insulin detemir]    Social History:  The patient  reports that he has never smoked. He has never used smokeless tobacco. He reports that he does not drink alcohol or use drugs.   Family History:  The patient's family history includes Bipolar disorder in his sister; Dementia in his maternal grandmother; Diabetes in his maternal grandmother; Drug abuse in his  sister; Heart disease in his maternal grandfather; Other in his father and mother.    ROS:  Please see the history of present illness.   Otherwise, review of systems are positive for none.   All other systems are reviewed and negative.    PHYSICAL EXAM: VS:  BP 116/70   Pulse 87   Ht 5\' 8"  (1.727 m)   Wt (!) 376 lb 1.9 oz (170.6 kg)   SpO2 98%   BMI 57.19 kg/m  , BMI Body mass index is 57.19 kg/m. Affect appropriate Obese  HEENT: normal Neck supple with no adenopathy JVP normal no bruits no thyromegaly Lungs clear with no wheezing and good diaphragmatic motion Heart:  S1/S2 SEM  murmur, no rub, gallop or click PMI normal Abdomen: benighn, BS positve, no tenderness, no AAA no bruit.  No HSM or HJR Distal pulses intact with no bruits No edema Neuro non-focal Skin warm and dry No muscular weakness   EKG:  NSR lateral T wave changes 06/01/16  11/06/18 SR rate 87 normal   Recent Labs: 03/30/2018: ALT 49 10/02/2018: BUN 14; Creatinine, Ser 0.97; Potassium 3.9; Sodium 135    Lipid Panel    Component Value Date/Time   CHOL 128 03/30/2018 1303   TRIG 126.0 03/30/2018 1303   HDL 34.90 (L) 03/30/2018 1303   CHOLHDL 4 03/30/2018 1303   VLDL 25.2 03/30/2018 1303   LDLCALC 68 03/30/2018 1303      Wt Readings from Last 3 Encounters:  11/06/18 (!) 376 lb 1.9 oz (170.6 kg)  10/05/18 (!) 375 lb 6.4 oz (170.3 kg)  09/14/18 (!) 361 lb 12.8 oz (164.1 kg)      Other studies Reviewed: Additional studies/ records that were reviewed today include: Took over 35 minutes to review care [facility] [place] only able to locate one cardiology office note from 05/15/13  Labs ECG;s notes regarding previous heart caths .    ASSESSMENT AND PLAN:  1. Prinzmetal Angina history not clear but continue calcium blocker and imdur new SL nitro called in 2. HTN  Well controlled.  Continue current medications and low sodium Dash type diet.   3. IDDM  Discussed low carb diet.  Target  hemoglobin A1c is 6.5 or less.  Continue current medications. 4. Obesity  /fu Dr Daphine DeutscherMartin at bariatric surgical center he has tried to be active and eat right not working  5. GERD related to obesity and diet continue nexium  6. OSA last testing 9 years ago did not fall asleep needs referral per Dr Luciana Axeankin  7. Cholesterol agree  with previously clean cath and LDL under 100 no need for statin especially if He feels worse on them 8. Murmur:  SEM on exam TTE with no significant valve disease observe  9. Obesity discussed bariatric surgery options and gave him referral #   Current medicines are reviewed at length with the patient today.  The patient does not have concerns regarding medicines.  The following changes have been made:  no change  Labs/ tests ordered today include: None   Orders Placed This Encounter  Procedures  . EKG 12-Lead     Disposition:   FU with me in a year      Signed, Charlton Haws, MD  11/06/2018 9:12 AM    [facility] [address] [place], [place], [place]  [address] Phone: [phone]; Fax: [phone]

## 2018-11-06 ENCOUNTER — Encounter: Payer: Self-pay | Admitting: Cardiovascular Disease

## 2018-11-06 ENCOUNTER — Ambulatory Visit: Payer: 59 | Admitting: Cardiovascular Disease

## 2018-11-06 VITALS — BP 116/70 | HR 87 | Ht 68.0 in | Wt 376.1 lb

## 2018-11-06 DIAGNOSIS — R011 Cardiac murmur, unspecified: Secondary | ICD-10-CM | POA: Diagnosis not present

## 2018-11-06 DIAGNOSIS — E785 Hyperlipidemia, unspecified: Secondary | ICD-10-CM

## 2018-11-06 DIAGNOSIS — I10 Essential (primary) hypertension: Secondary | ICD-10-CM

## 2018-11-06 NOTE — Patient Instructions (Addendum)

## 2018-11-27 ENCOUNTER — Telehealth: Payer: Self-pay

## 2018-11-27 ENCOUNTER — Telehealth: Payer: Self-pay | Admitting: Endocrinology

## 2018-11-27 NOTE — Telephone Encounter (Signed)
PA initiated via [email or web address] for Rybelsus 7mg  tablets take 1 tablet by mouth once daily. KEY: [id number]

## 2018-11-27 NOTE — Telephone Encounter (Signed)
Cover my meds is calling in regards to a PA and would like to know if we need any assistance with this PA  They requested a call back this afternoon   Semaglutide (RYBELSUS) 7 MG TABS   PH- [phone] REF- [id number]

## 2018-11-27 NOTE — Telephone Encounter (Signed)
PA was submitted via covermymeds. Currently waiting on clinical questionnaire.

## 2018-11-28 NOTE — Telephone Encounter (Signed)
Completed insurance clinical questions for PA for Ryblesus. Awaiting determination at this time.

## 2018-11-29 ENCOUNTER — Telehealth: Payer: Self-pay

## 2018-11-29 NOTE — Telephone Encounter (Signed)
rybelsus 7 mg was approved from 11/28/2018-11/29/2019 by [employer]

## 2018-12-17 ENCOUNTER — Other Ambulatory Visit: Payer: Self-pay

## 2018-12-17 ENCOUNTER — Telehealth: Payer: Self-pay | Admitting: Endocrinology

## 2018-12-17 MED ORDER — METFORMIN HCL 850 MG PO TABS: 850.0000 mg | ORAL_TABLET | Freq: Three times a day (TID) | ORAL | 2 refills | Status: AC

## 2018-12-17 MED ORDER — INSULIN REGULAR HUMAN (CONC) 500 UNIT/ML ~~LOC~~ SOPN: PEN_INJECTOR | SUBCUTANEOUS | 1 refills | Status: AC

## 2018-12-17 NOTE — Telephone Encounter (Signed)
RX sent

## 2018-12-17 NOTE — Telephone Encounter (Signed)
[facility] :-patient now using this pharmacy due to insurance- [facility] [place]) ph# [phone] called re: patient requested pharmacy contact our office to request the following 2 RX's:  MEDICATION: metFORMIN (GLUCOPHAGE) 850 MG tablet AND  insulin regular human CONCENTRATED (HUMULIN R U-500 KWIKPEN) 500 UNIT/ML [employer]  [facility]ACY:  [facility] or [facility]) [address], [place], [place] [address], Fax# [phone]  IS THIS A 90 DAY SUPPLY : would be preferred  IS PATIENT OUT OF MEDICATION: No  IF NOT; HOW MUCH IS LEFT: a couple of days  LAST APPOINTMENT DATE: @12 /03/2018  NEXT APPOINTMENT DATE:@3 /12/2018  DO WE HAVE YOUR PERMISSION TO LEAVE A DETAILED MESSAGE:  OTHER COMMENTS: Pharmacist requests RX';s be filled so that he can ship out the above medications tomorrow 12/18/18   **Let patient know to contact pharmacy at the end of the day to make sure medication is ready. **  ** Please notify patient to allow 48-72 hours to process**  **Encourage patient to contact the pharmacy for refills or they can request refills through [facility]** 2. Humalin

## 2018-12-27 ENCOUNTER — Ambulatory Visit: Payer: Commercial Managed Care - PPO | Admitting: Internal Medicine

## 2018-12-27 ENCOUNTER — Encounter: Payer: Self-pay | Admitting: Internal Medicine

## 2018-12-27 DIAGNOSIS — M5442 Lumbago with sciatica, left side: Secondary | ICD-10-CM | POA: Diagnosis not present

## 2018-12-27 DIAGNOSIS — G8929 Other chronic pain: Secondary | ICD-10-CM | POA: Diagnosis not present

## 2018-12-27 DIAGNOSIS — M5441 Lumbago with sciatica, right side: Secondary | ICD-10-CM | POA: Diagnosis not present

## 2018-12-27 DIAGNOSIS — M545 Low back pain, unspecified: Secondary | ICD-10-CM | POA: Insufficient documentation

## 2018-12-27 MED ORDER — GABAPENTIN 300 MG PO CAPS: 300.0000 mg | ORAL_CAPSULE | Freq: Two times a day (BID) | ORAL | 3 refills | Status: DC

## 2018-12-27 MED ORDER — METHYLPREDNISOLONE ACETATE 40 MG/ML IJ SUSP
40.0000 mg | Freq: Once | INTRAMUSCULAR | Status: AC
  Administered 2018-12-27: 40 mg via INTRAMUSCULAR

## 2018-12-27 NOTE — Patient Instructions (Addendum)
We have sent in gabapentin that you can use for pain at night time. After 3 days you can increase to twice a day if needed.   You can see Dr. Katrinka Blazing if you want for the back.

## 2018-12-27 NOTE — Progress Notes (Signed)
   Subjective:   Patient ID: Henry Vasquez, male    DOB: 12-15-1965, 53 y.o.   MRN: [id number]  HPI The patient is a 53 YO man coming in for low back and leg pain. Has been intermittent for some time. He is morbidly obese and is aware that this is part of the problem. He asks if we can prescribe heroin as he has heard that this helps. He then wants to know if we can lipo-suction all the weight off to fix it. Denies falls recently. Does have history of MI and diabetes which is reasonably controlled. He is also moving soon and has been recommended to see sports medicine for his back in the past. He was told that this is arthritis in the past. Has tried tylenol which is not helping much.   Review of Systems  Constitutional: Positive for activity change. Negative for appetite change, fatigue, fever and unexpected weight change.  Respiratory: Negative.   Cardiovascular: Negative.   Musculoskeletal: Positive for back pain and myalgias. Negative for arthralgias.  Skin: Negative.   Neurological: Negative for syncope, weakness and numbness.    Objective:  Physical Exam Constitutional:      Appearance: He is well-developed. He is obese.  HENT:     Head: Normocephalic and atraumatic.  Neck:     Musculoskeletal: Normal range of motion.  Cardiovascular:     Rate and Rhythm: Normal rate and regular rhythm.  Pulmonary:     Effort: Pulmonary effort is normal. No respiratory distress.     Breath sounds: Normal breath sounds. No wheezing or rales.  Abdominal:     General: There is distension.     Palpations: Abdomen is soft.     Tenderness: There is no abdominal tenderness. There is no rebound.     Comments: adiposity  Musculoskeletal:        General: Tenderness present.  Skin:    General: Skin is warm and dry.  Neurological:     Mental Status: He is alert and oriented to person, place, and time.     Coordination: Coordination normal.     Vitals:   12/27/18 1512  BP: 126/70  Pulse: 95    Temp: 97.8 F (36.6 C)  TempSrc: Oral  SpO2: 95%  Weight: (!) 384 lb (174.2 kg)  Height: 5\' 8"  (1.727 m)    Assessment & Plan:  Depo-medrol 40 mg IM

## 2018-12-28 NOTE — Assessment & Plan Note (Signed)
Given depo-medrol 40 mg IM at visit. Advised to consider seeing sports medicine but he is moving out of area in 2 months and is not sure he wants to start care here. Informed that heroin is not safe and certainly cannot be prescribed as it is illegal. Advised to use tylenol over the counter as this is safe for pain. Rx for gabapentin for pain. Would not prescribe any opioids due to "jokes" about heroin and "jokes" about morphine to CMA.

## 2018-12-31 ENCOUNTER — Other Ambulatory Visit: Payer: Self-pay

## 2018-12-31 ENCOUNTER — Other Ambulatory Visit (INDEPENDENT_AMBULATORY_CARE_PROVIDER_SITE_OTHER): Payer: Commercial Managed Care - PPO

## 2018-12-31 DIAGNOSIS — Z794 Long term (current) use of insulin: Secondary | ICD-10-CM | POA: Diagnosis not present

## 2018-12-31 DIAGNOSIS — E1165 Type 2 diabetes mellitus with hyperglycemia: Secondary | ICD-10-CM

## 2018-12-31 LAB — COMPREHENSIVE METABOLIC PANEL
ALBUMIN: 4.1 g/dL (ref 3.5–5.2)
ALK PHOS: 79 U/L (ref 39–117)
ALT: 50 U/L (ref 0–53)
AST: 38 U/L — ABNORMAL HIGH (ref 0–37)
BUN: 18 mg/dL (ref 6–23)
CALCIUM: 9.6 mg/dL (ref 8.4–10.5)
CHLORIDE: 99 meq/L (ref 96–112)
CO2: 27 mEq/L (ref 19–32)
Creatinine, Ser: 0.9 mg/dL (ref 0.40–1.50)
GFR: 88.08 mL/min (ref >60.00)
Glucose, Bld: 210 mg/dL — ABNORMAL HIGH (ref 70–99)
POTASSIUM: 4.3 meq/L (ref 3.5–5.1)
Sodium: 136 mEq/L (ref 135–145)
Total Bilirubin: 0.4 mg/dL (ref 0.2–1.2)
Total Protein: 7.1 g/dL (ref 6.0–8.3)

## 2018-12-31 LAB — LIPID PANEL
CHOLESTEROL: 142 mg/dL (ref 0–200)
HDL: 54.5 mg/dL (ref >39.00)
LDL CALC: 54 mg/dL (ref 0–99)
NonHDL: 87.72
TRIGLYCERIDES: 167 mg/dL — AB (ref 0.0–149.0)
Total CHOL/HDL Ratio: 3
VLDL: 33.4 mg/dL (ref 0.0–40.0)

## 2018-12-31 LAB — HEMOGLOBIN A1C: HEMOGLOBIN A1C: 8.5 % — AB (ref 4.6–6.5)

## 2019-01-01 ENCOUNTER — Encounter: Payer: Self-pay | Admitting: Gastroenterology

## 2019-01-03 ENCOUNTER — Encounter: Payer: Self-pay | Admitting: Endocrinology

## 2019-01-03 ENCOUNTER — Ambulatory Visit: Payer: 59 | Admitting: Endocrinology

## 2019-01-03 VITALS — BP 126/70 | HR 96 | Ht 68.0 in | Wt 385.0 lb

## 2019-01-03 DIAGNOSIS — E1165 Type 2 diabetes mellitus with hyperglycemia: Secondary | ICD-10-CM

## 2019-01-03 DIAGNOSIS — I10 Essential (primary) hypertension: Secondary | ICD-10-CM

## 2019-01-03 DIAGNOSIS — Z794 Long term (current) use of insulin: Secondary | ICD-10-CM

## 2019-01-03 MED ORDER — SEMAGLUTIDE(0.25 OR 0.5MG/DOS) 2 MG/1.5ML ~~LOC~~ SOPN: 0.5000 mg | PEN_INJECTOR | SUBCUTANEOUS | 2 refills | Status: AC

## 2019-01-03 MED ORDER — FREESTYLE LIBRE 14 DAY SENSOR MISC: 1.0000 [IU] | 2 refills | Status: DC

## 2019-01-03 NOTE — Progress Notes (Signed)
Patient ID: Henry Vasquez, male   DOB: [birth year], 53 y.o.   MRN: [id number]          Reason for Appointment: Follow-up for Type 2 Diabetes  History of Present Illness:          Date of diagnosis of type 2 diabetes mellitus: 2004        Background history:    At initial diagnosis he had symptoms of increased thirst, confusion and increased blood sugar He was treated mostly with metformin and possibly Avandia, no detailed history isn't available He started taking insulin about 5 years later and metformin has been continued He is new to the area as of 2018 and previous records not available He thinks he was seen by an endocrinologist about 7 years ago but not regularly and has been managed mostly by PCPs  Recent history:   INSULIN regimen is:   Humulin R U-500: 150units tid , Basaglar 150 units-a.m., 150 units p.m.      Non-insulin hypoglycemic drugs the patient is taking are: Metformin  3 times a day,  His A1c is increased at further at 8.5 and has been as low as 7 previously   Current management, blood sugar patterns and problems identified:  He was given Rybelsus on his last visit since he could not afford it  However even though he had no side effects from Ozempic he was having abdominal discomfort and some loose stools with Rybelsus 3 mg and stopped it  However instead of adjusting his mealtime insulin to control postprandial higher sugars he is taking more Basaglar in the evening  With this he is getting HYPOGLYCEMIA early morning frequently  Morning sugars are the lowest of the day  He is having fairly significantly high blood sugars at night supper and periodically after lunch also  Has not been using the freestyle libre since about January because of high out-of-pocket expense and is not able to check his sugars as much with his Contour  He still has limitations with his physical activity and not exercising  Weight has gone up with not taking the GLP-1  drug  He does try to take his Humulin R consistently before meals  Currently with his busy schedule he is not planning his meals well and sometimes eating higher fat or higher carbohydrate meals        Side effects from medications have been: None  Compliance with the medical regimen: Fair Hypoglycemia: None    Glucose monitoring:  3 times a day      Glucometer:  Contour    PRE-MEAL Fasting Lunch Dinner Bedtime Overall  Glucose range:  41-286      Averages:  112  239  179  204   POST-MEAL PC Breakfast PC Lunch PC Dinner  Glucose range:   277  147-448  Averages:    273    Self-care: The diet that the patient has been following is: tries to limit sugars       Meal times are:  Breakfast is at 7 AM Lunch: 2 PM Dinner: 6:30 PM    Typical meal intake: Breakfast is usually a biscuit  Snacks may be small candy, chips                Dietician visit, most recent:2004               Exercise: a little walking with his dog  Weight history:  Wt Readings from Last 3 Encounters:  01/03/19 (!) 385  lb (174.6 kg)  12/27/18 (!) 384 lb (174.2 kg)  11/06/18 (!) 376 lb 1.9 oz (170.6 kg)    Glycemic control:   Lab Results  Component Value Date   HGBA1C 8.5 (H) 12/31/2018   HGBA1C 7.5 (H) 10/02/2018   HGBA1C 7.0 (H) 08/20/2018      Lab Results  Component Value Date   MICROALBUR 2.8 (H) 03/30/2018   LDLCALC 54 12/31/2018   CREATININE 0.90 12/31/2018   Lab Results  Component Value Date   MICRALBCREAT 2.5 03/30/2018    Lab Results  Component Value Date   FRUCTOSAMINE 212 09/14/2017      Allergies as of 01/03/2019      Reactions   Levemir [insulin Detemir] Other (See Comments)   Severe pain around site of the injection, redness      Medication List       Accurate as of January 03, 2019  9:59 AM. Always use your most recent med list.        albuterol 108 (90 Base) MCG/ACT inhaler Commonly known as:  PROVENTIL HFA;VENTOLIN HFA Inhale 2 puffs into the lungs every 6  (six) hours as needed for wheezing or shortness of breath.   amLODipine 5 MG tablet Commonly known as:  NORVASC Take 1 tablet (5 mg total) by mouth daily.   BASAGLAR KWIKPEN 100 UNIT/ML Sopn Inject 1.5 mLs (150 Units total) into the skin 2 (two) times daily.   escitalopram 10 MG tablet Commonly known as:  LEXAPRO Take 1 tablet (10 mg total) by mouth daily.   esomeprazole 40 MG capsule Commonly known as:  NEXIUM Take 1 capsule (40 mg total) by mouth at bedtime.   FREESTYLE LIBRE 14 DAY SENSOR Misc 1 Units by Does not apply route every 14 (fourteen) days.   gabapentin 300 MG capsule Commonly known as:  NEURONTIN Take 1 capsule (300 mg total) by mouth 2 (two) times daily.   insulin regular human CONCENTRATED 500 UNIT/ML kwikpen Commonly known as:  HUMULIN R U-500 KWIKPEN INJECT 120 UNITS BEFORE EACH MEAL.   isosorbide mononitrate 30 MG 24 hr tablet Commonly known as:  IMDUR Take 3 tablets (90 mg total) by mouth daily. TAKE THREE TABLETS BY MOUTH  IN THE AM   lisinopril 5 MG tablet Commonly known as:  PRINIVIL,ZESTRIL Take 1 tablet (5 mg total) by mouth daily.   metFORMIN 850 MG tablet Commonly known as:  GLUCOPHAGE Take 1 tablet (850 mg total) by mouth 3 (three) times daily.   nitroGLYCERIN 0.4 MG SL tablet Commonly known as:  NITROSTAT Place 1 tablet (0.4 mg total) under the tongue every 5 (five) minutes as needed for chest pain.   Semaglutide(0.25 or 0.5MG /DOS) 2 MG/1.5ML Sopn Commonly known as:  OZEMPIC (0.25 OR 0.5 MG/DOSE) Inject 0.5 mg into the skin once a week.       Allergies:  Allergies  Allergen Reactions  . Levemir [Insulin Detemir] Other (See Comments)    Severe pain around site of the injection, redness    Past Medical History:  Diagnosis Date  . Anxiety   . Asthma   . Barrett esophagus   . Coronary artery disease   . Depression   . Diabetes mellitus, type 2 (HCC)   . Fistula    mouth  . GERD (gastroesophageal reflux disease)   . Heart  attack (HCC) 10/2011  . Hiatal hernia   . History of Prinzmetal angina   . Obesity   . Pneumonia   . Prinzmetal angina (HCC)   .  Seasonal allergies   . Sleep apnea   . Stomach ulcer     Past Surgical History:  Procedure Laterality Date  . CARDIAC CATHETERIZATION  2002,2012  . CHOLECYSTECTOMY    . CLEFT LIP REPAIR    . CLEFT PLATE REPAIR     multiple surgeries  . ESOPHAGOGASTRODUODENOSCOPY    . FLEXIBLE SIGMOIDOSCOPY    . TONSILLECTOMY      Family History  Problem Relation Age of Onset  . Other Mother        borderline personality disorder  . Other Father        Dad unknown to patient  . Bipolar disorder Sister        1/2 sister  . Drug abuse Sister   . Diabetes Maternal Grandmother   . Dementia Maternal Grandmother   . Heart disease Maternal Grandfather     Social History:  reports that he has never smoked. He has never used smokeless tobacco. He reports that he does not drink alcohol or use drugs.   Review of Systems   Lipid history:  His last LDL was below 70 again  Not on statin drugs    Lab Results  Component Value Date   CHOL 142 12/31/2018   HDL 54.50 12/31/2018   LDLCALC 54 12/31/2018   TRIG 167.0 (H) 12/31/2018   CHOLHDL 3 12/31/2018           Hypertension:Treated with amlodipine and lisinopril 5 mg by his PCP   Last eye exam report not available   Physical Examination:  BP 126/70 (BP Location: Left Arm, Patient Position: Sitting, Cuff Size: Normal)   Pulse 96   Ht 5\' 8"  (1.727 m)   Wt (!) 385 lb (174.6 kg)   SpO2 97%   BMI 58.54 kg/m            ASSESSMENT:  Diabetes type 2, with morbid obesity     See history of present illness for detailed discussion of current diabetes management, blood sugar patterns and problems identified  His A1c is 8.5, previously 7.5  He had done very well with adding a GLP-1 drug to his large doses of basal bolus insulin However he cannot tolerate Rybelsus even though it could take Ozempic  previously Currently is not having balanced control of his diabetes with getting too much basal insulin and not enough to cover his meals especially at suppertime Also diet has been variable with less planning Gaining weight as expected Also previously was able to keep up with his diabetes better with using the freestyle libre which was not covered by his previous insurance   PLAN:    He will try Ozempic again as this appears to be covered by his new insurance  He will need to start with 0.25 and take this once or twice a week and then go up to 0.5 if no nausea  [place]  Consider 1 mg Ozempic subsequently  He needs to increase his Humulin R at least 20 to 30 units at lunch and dinner based on his meal size and carbs  Meanwhile go back down to 100 units of Basaglar in the evening to avoid low sugars overnight  Consistent diet and exercise  Freestyle libre prescription was again sent  Follow-up to be decided  Patient Instructions  Extra 20-30 U-500 at lunch and supper  PM basaglar 100 -120 units  Ozempic .25 for 1-2 shots then 0.5    Counseling  time on subjects discussed in assessment and plan sections is over 50% of today's 25 minute visit    Reather Littler 01/03/2019, 9:59 AM   Note: This office note was prepared with Dragon voice recognition system technology. Any transcriptional errors that result from this process are unintentional.

## 2019-01-03 NOTE — Patient Instructions (Addendum)
Extra 20-30 U-500 at lunch and supper  PM basaglar 100 -120 units  Ozempic .25 for 1-2 shots then 0.5

## 2019-01-11 ENCOUNTER — Other Ambulatory Visit: Payer: Self-pay | Admitting: Family

## 2019-01-11 MED ORDER — ISOSORBIDE MONONITRATE ER 30 MG PO TB24: 90.0000 mg | ORAL_TABLET | Freq: Every day | ORAL | 1 refills | Status: AC

## 2019-01-11 MED ORDER — ESOMEPRAZOLE MAGNESIUM 40 MG PO CPDR: 40.0000 mg | DELAYED_RELEASE_CAPSULE | Freq: Every day | ORAL | 2 refills | Status: AC

## 2019-01-11 MED ORDER — LISINOPRIL 5 MG PO TABS: 5.0000 mg | ORAL_TABLET | Freq: Every day | ORAL | 2 refills | Status: AC

## 2019-01-11 MED ORDER — AMLODIPINE BESYLATE 5 MG PO TABS: 5.0000 mg | ORAL_TABLET | Freq: Every day | ORAL | 2 refills | Status: AC

## 2019-01-11 MED ORDER — ESCITALOPRAM OXALATE 10 MG PO TABS: 10.0000 mg | ORAL_TABLET | Freq: Every day | ORAL | 2 refills | Status: AC

## 2019-01-11 NOTE — Telephone Encounter (Signed)
Copied from CRM [id number]. Topic: Quick Communication - Rx Refill/Question >> Jan 11, 2019  3:37 PM Laural Benes, [place] C wrote: Medication:    All 90 day supply   amLODipine (NORVASC) 5 MG tablet esomeprazole (NEXIUM) 40 MG capsule lisinopril (PRINIVIL,ZESTRIL) 5 MG tablet escitalopram (LEXAPRO) 10 MG tablet isosorbide mononitrate (IMDUR) 30 MG 24 hr tablet   Has the patient contacted their pharmacy? Yes  (Agent: If no, request that the patient contact the pharmacy for the refill.) (Agent: If yes, when and what did the pharmacy advise?)  Preferred Pharmacy (with phone number or street name): [facility], [place] - [address] [phone] (Phone) [phone] (Fax)    Agent: Please be advised that RX refills may take up to 3 business days. We ask that you follow-up with your pharmacy.

## 2019-01-11 NOTE — Telephone Encounter (Signed)
Requested Prescriptions  Pending Prescriptions Disp Refills  . amLODipine (NORVASC) 5 MG tablet 90 tablet 2    Sig: Take 1 tablet (5 mg total) by mouth daily.     Cardiovascular:  Calcium Channel Blockers Passed - 01/11/2019  3:43 PM      Passed - Last BP in normal range    BP Readings from Last 1 Encounters:  01/03/19 126/70         Passed - Valid encounter within last 6 months    Recent Outpatient Visits          2 weeks ago Chronic bilateral low back pain with bilateral sciatica   [facility] -Willis Modena, MD   3 months ago Acute non-recurrent maxillary sinusitis   [facility] -Marquette Saa, Bobette Mo, MD   4 months ago Essential hypertension   [facility] -Shanna Cisco, Allyne Gee, FNP           . esomeprazole (NEXIUM) 40 MG capsule 90 capsule 2    Sig: Take 1 capsule (40 mg total) by mouth at bedtime.     Gastroenterology: Proton Pump Inhibitors Passed - 01/11/2019  3:43 PM      Passed - Valid encounter within last 12 months    Recent Outpatient Visits          2 weeks ago Chronic bilateral low back pain with bilateral sciatica   [facility] -Willis Modena, MD   3 months ago Acute non-recurrent maxillary sinusitis   [facility] -[place] Saa, Bobette Mo, MD   4 months ago Essential hypertension   [facility] -Shanna Cisco, Allyne Gee, FNP           . lisinopril (PRINIVIL,ZESTRIL) 5 MG tablet 90 tablet 2    Sig: Take 1 tablet (5 mg total) by mouth daily.     Cardiovascular:  ACE Inhibitors Passed - 01/11/2019  3:43 PM      Passed - Cr in normal range and within 180 days    Creatinine, Ser  Date Value Ref Range Status  12/31/2018 0.90 0.40 - 1.50 mg/dL Final         Passed - K in normal range and within 180 days    Potassium  Date Value Ref Range Status  12/31/2018 4.3 3.5 - 5.1 mEq/L Final         Passed -  Patient is not pregnant      Passed - Last BP in normal range    BP Readings from Last 1 Encounters:  01/03/19 126/70         Passed - Valid encounter within last 6 months    Recent Outpatient Visits          2 weeks ago Chronic bilateral low back pain with bilateral sciatica   [facility] -Willis Modena, MD   3 months ago Acute non-recurrent maxillary sinusitis   [facility] -[place] Saa, Bobette Mo, MD   4 months ago Essential hypertension   [facility] -Shanna Cisco, Allyne Gee, FNP           . escitalopram (LEXAPRO) 10 MG tablet 90 tablet 2    Sig: Take 1 tablet (10 mg total) by mouth daily.     Psychiatry:  Antidepressants - SSRI Passed - 01/11/2019  3:43 PM      Passed - Valid encounter within last 6  months    Recent Outpatient Visits          2 weeks ago Chronic bilateral low back pain with bilateral sciatica   [facility] -Willis Modena, MD   3 months ago Acute non-recurrent maxillary sinusitis   [facility] -Kieth Brightly, MD   4 months ago Essential hypertension   [facility] -Shanna Cisco, Allyne Gee, FNP           . isosorbide mononitrate (IMDUR) 30 MG 24 hr tablet 270 tablet 1    Sig: Take 3 tablets (90 mg total) by mouth daily. TAKE THREE TABLETS BY MOUTH  IN THE AM     Cardiovascular:  Nitrates Passed - 01/11/2019  3:43 PM      Passed - Last BP in normal range    BP Readings from Last 1 Encounters:  01/03/19 126/70         Passed - Last Heart Rate in normal range    Pulse Readings from Last 1 Encounters:  01/03/19 96         Passed - Valid encounter within last 12 months    Recent Outpatient Visits          2 weeks ago Chronic bilateral low back pain with bilateral sciatica   [facility] -Willis Modena, MD   3 months ago Acute non-recurrent maxillary sinusitis    [facility] -[place]quette Saa, Bobette Mo, MD   4 months ago Essential hypertension   [facility] -Shanna Cisco, Allyne Gee, FNP            Pt requesting change of pharmacy

## 2019-01-19 ENCOUNTER — Other Ambulatory Visit: Payer: Self-pay | Admitting: Internal Medicine

## 2019-02-14 ENCOUNTER — Other Ambulatory Visit: Payer: Self-pay

## 2019-02-14 ENCOUNTER — Telehealth: Payer: Self-pay | Admitting: Endocrinology

## 2019-02-14 MED ORDER — INSULIN GLARGINE 100 UNIT/ML SOLOSTAR PEN: PEN_INJECTOR | SUBCUTANEOUS | 1 refills | Status: AC

## 2019-02-14 NOTE — Telephone Encounter (Signed)
Rx sent 

## 2019-02-14 NOTE — Telephone Encounter (Signed)
MEDICATION: Lantus  PHARMACY: [facility] - [place], [place] - [address]   IS THIS A 90 DAY SUPPLY : Yes  IS PATIENT OUT OF MEDICATION: Yes  IF NOT; HOW MUCH IS LEFT:   LAST APPOINTMENT DATE: @3 /03/2019  NEXT APPOINTMENT DATE:@Visit  date not found  DO WE HAVE YOUR PERMISSION TO LEAVE A DETAILED MESSAGE:  OTHER COMMENTS:  Patients insurance will not cover Basaglar  **Let patient know to contact pharmacy at the end of the day to make sure medication is ready. **  ** Please notify patient to allow 48-72 hours to process**  **Encourage patient to contact the pharmacy for refills or they can request refills through [facility]**

## 2019-02-18 ENCOUNTER — Encounter: Payer: Self-pay | Admitting: Internal Medicine

## 2019-02-18 ENCOUNTER — Ambulatory Visit (INDEPENDENT_AMBULATORY_CARE_PROVIDER_SITE_OTHER): Payer: Commercial Managed Care - PPO | Admitting: Internal Medicine

## 2019-02-18 DIAGNOSIS — J0100 Acute maxillary sinusitis, unspecified: Secondary | ICD-10-CM | POA: Diagnosis not present

## 2019-02-18 MED ORDER — BECLOMETHASONE DIPROP HFA 40 MCG/ACT IN AERB: 1.0000 | INHALATION_SPRAY | Freq: Two times a day (BID) | RESPIRATORY_TRACT | 0 refills | Status: DC

## 2019-02-18 MED ORDER — AMOXICILLIN-POT CLAVULANATE 875-125 MG PO TABS: 1.0000 | ORAL_TABLET | Freq: Two times a day (BID) | ORAL | 0 refills | Status: DC

## 2019-02-18 NOTE — Progress Notes (Signed)
Virtual Visit via Video Note  I connected with Henry Vasquez on 02/18/19 at  3:00 PM EDT by a video enabled telemedicine application and verified that I am speaking with the correct person using two identifiers.   I discussed the limitations of evaluation and management by telemedicine and the availability of in person appointments. The patient expressed understanding and agreed to proceed.  History of Present Illness: The patient is a 53 y.o. man with visit for sinus problems and mild SOB. Started about 3 weeks ago. Was moving to [place] in the last week and was unable to get care before now. He normally has allergies and is taking zyrtec which helps some. Had asthma in the past and often gets a flare with sinus infections. Denies fevers or chills. Non-productive cough which is minor. Denies headaches or body aches. No known sick contacts and is practicing social distancing. Overall it is worsening. Has tried zyrtec and otc sinus products.  Observations/Objective: Appearance: mildly sick, breathing appears normal, casual grooming, abdomen does not appear distended, throat red, mental status is A and O times 3  Assessment and Plan: See problem oriented charting  Follow Up Instructions: rx for qvar and augmentin, continue zyrtec  I discussed the assessment and treatment plan with the patient. The patient was provided an opportunity to ask questions and all were answered. The patient agreed with the plan and demonstrated an understanding of the instructions.   The patient was advised to call back or seek an in-person evaluation if the symptoms worsen or if the condition fails to improve as anticipated.  Myrlene Broker, MD

## 2019-02-18 NOTE — Assessment & Plan Note (Signed)
Rx for augmentin 10 day course and rx for qvar. Continue zyrtec daily and advised can add flonase too.

## 2019-02-19 ENCOUNTER — Telehealth: Payer: Self-pay

## 2019-02-19 ENCOUNTER — Telehealth: Payer: Self-pay | Admitting: Family

## 2019-02-19 MED ORDER — FLUTICASONE PROPIONATE HFA 44 MCG/ACT IN AERO: 1.0000 | INHALATION_SPRAY | Freq: Two times a day (BID) | RESPIRATORY_TRACT | 0 refills | Status: DC

## 2019-02-19 NOTE — Telephone Encounter (Signed)
Pt. Reports his insurance does not cover Qvar inhaler. Will cover Flovent inhaler. Please send to [facility] in [place] [phone]. Please advise pt.

## 2019-02-19 NOTE — Telephone Encounter (Signed)
Have sent in to the pharmacy we discussed at the virtual visit.

## 2019-02-19 NOTE — Telephone Encounter (Signed)
Noted. PA was completed.

## 2019-02-19 NOTE — Telephone Encounter (Signed)
Patient is calling in regards to the status of his prior auth for his Lantus. States he does have 5 left.  Please Advise, Thanks

## 2019-02-19 NOTE — Telephone Encounter (Signed)
PA initiated via [email or web address] for Lantus Solostar pens. [id number]

## 2019-02-19 NOTE — Addendum Note (Signed)
Addended by: Hillard Danker A on: 02/19/2019 02:42 PM   Modules accepted: Orders

## 2019-04-25 ENCOUNTER — Other Ambulatory Visit: Payer: Self-pay | Admitting: Endocrinology

## 2019-04-25 ENCOUNTER — Other Ambulatory Visit: Payer: Self-pay | Admitting: Internal Medicine

## 2019-04-25 NOTE — Telephone Encounter (Signed)
Pt said the below med helps him a lot and that is why he is requesting a refill , he moved and will see his new PCP in July   fluticasone (FLOVENT HFA) 44 MCG/ACT inhaler   gabapentin (NEURONTIN) 300 MG capsule   Henry Vasquez [address] [facility] [place]

## 2019-04-25 NOTE — Telephone Encounter (Signed)
Routing to CMA 

## 2019-04-30 ENCOUNTER — Encounter: Payer: Self-pay | Admitting: Internal Medicine

## 2019-04-30 ENCOUNTER — Ambulatory Visit: Payer: Self-pay

## 2019-04-30 ENCOUNTER — Ambulatory Visit (INDEPENDENT_AMBULATORY_CARE_PROVIDER_SITE_OTHER): Payer: Commercial Managed Care - PPO | Admitting: Internal Medicine

## 2019-04-30 DIAGNOSIS — J0100 Acute maxillary sinusitis, unspecified: Secondary | ICD-10-CM

## 2019-04-30 MED ORDER — AMOXICILLIN-POT CLAVULANATE 875-125 MG PO TABS: 1.0000 | ORAL_TABLET | Freq: Two times a day (BID) | ORAL | 0 refills | Status: AC

## 2019-04-30 MED ORDER — MONTELUKAST SODIUM 10 MG PO TABS: 10.0000 mg | ORAL_TABLET | Freq: Every day | ORAL | 3 refills | Status: AC

## 2019-04-30 NOTE — Assessment & Plan Note (Signed)
This is his 3rd course of antibiotics in the last 3 months. Rx for singulair and augmentin to help with sinuses overall and current infection.

## 2019-04-30 NOTE — Progress Notes (Signed)
Virtual Visit via Video Note  I connected with Henry Vasquez on 04/30/19 at  1:20 PM EDT by a video enabled telemedicine application and verified that I am speaking with the correct person using two identifiers.  The patient and the provider were at separate locations throughout the entire encounter.   I discussed the limitations of evaluation and management by telemedicine and the availability of in person appointments. The patient expressed understanding and agreed to proceed.  History of Present Illness: The patient is a 53 y.o. man with visit for sinus problems. Started about 10 days ago. Having pain in the sinuses, fatigue, denies fevers or chills. Has tried otc allergy medicine. Taking flovent since last visit 1-2 months ago and this is helping. Did recover after last course of antibiotics end of April. Denies SOB. Does have cough with green sputum. Overall it is worsening. Has tried otc meds.  Observations/Objective: Appearance: normal, obese, breathing appears normal, casual grooming, abdomen does not appear distended, throat normal, sinus pressure frontal to self palpation, mental status is A and O times 3  Assessment and Plan: See problem oriented charting  Follow Up Instructions: rx augmentin and singulair  I discussed the assessment and treatment plan with the patient. The patient was provided an opportunity to ask questions and all were answered. The patient agreed with the plan and demonstrated an understanding of the instructions.   The patient was advised to call back or seek an in-person evaluation if the symptoms worsen or if the condition fails to improve as anticipated.  Hoyt Koch, MD

## 2019-04-30 NOTE — Telephone Encounter (Signed)
appt scheduled

## 2019-04-30 NOTE — Telephone Encounter (Signed)
Incoming call from Patient with complaint of fatigue and nasal  Congestion , sinus pain around the eyes. Rates it moderate.  Reports his throat is scratchy.   Coughing up green mucous.  Pt.  Request a virtual appointment. Reviewed protocol and provided care advice.  Transferred Call to [facility] office.              Grady Male, 53 y.o., [birth year] MRN:  [id number] Phone:  [phone] Jerilynn Mages) PCP:  Marrian Salvage, FNP Primary Cvg:  Generic Worker's Comp/Generic Worker's Comp Message from Leretha Pol sent at 04/30/2019 8:22 AM EDT  Pt called stating he is fatigued, congestion, upset stomach, coughing up very thick mucus, and some chills for one week. Please advise.   Call History   Type Contact  04/30/2019 08:19 AM EDT Phone (Incoming) Jonette Pesa (Self)  Phone: [phone] (H)  User: Celene Kras A    Reason for Disposition . [1] Sinus congestion (pressure, fullness) AND [2] present > 10 days  Answer Assessment - Initial Assessment Questions 1. LOCATION: "Where does it hurt?"    sinus 2. ONSET: "When did the sinus pain start?"  (e.g., hours, days)      10days 3. SEVERITY: "How bad is the pain?"   (Scale 1-10; mild, moderate or severe)   - MILD (1-3): doesn't interfere with normal activities    - MODERATE (4-7): interferes with normal activities (e.g., work or school) or awakens from sleep   - SEVERE (8-10): excruciating pain and patient unable to do any normal activities        moderateECURRENT SYMPTOM: "Have you ever had sinus problems before?" If so, ask: "When was the last time?" and "What happened that time?"      yes 5. NASAL CONGESTION: "Is the nose blocked?" If so, ask, "Can you open it or must you breathe through the mouth?"     Breathe through nose  6. NASAL DISCHARGE: "Do you have discharge from your nose?" If so ask, "What color?"   green 7. FEVER: "Do you have a fever?" If so, ask: "What is it, how was it measured, and when did  it start?"      denies 8. OTHER SYMPTOMS: "Do you have any other symptoms?" (e.g., sore throat, cough, earache, difficulty breathing)    scratchy 9. PREGNANCY: "Is there any chance you are pregnant?" "When was your last menstrual period?"     na  Protocols used: SINUS PAIN OR CONGESTION-A-AH

## 2019-07-29 ENCOUNTER — Other Ambulatory Visit: Payer: Self-pay | Admitting: Endocrinology

## 2020-10-02 IMAGING — DX DG LUMBAR SPINE 2-3V
3 series · 3 of 3 positions shown · non-contrast
Comparison: None.

CLINICAL DATA: 52-year-old male with chronic bilateral knee pain
for 4 years greater on left post fall 4 years ago. No recent injury.
Initial encounter.

EXAM:
LUMBAR SPINE - 2-3 VIEW

[l-spine ap]
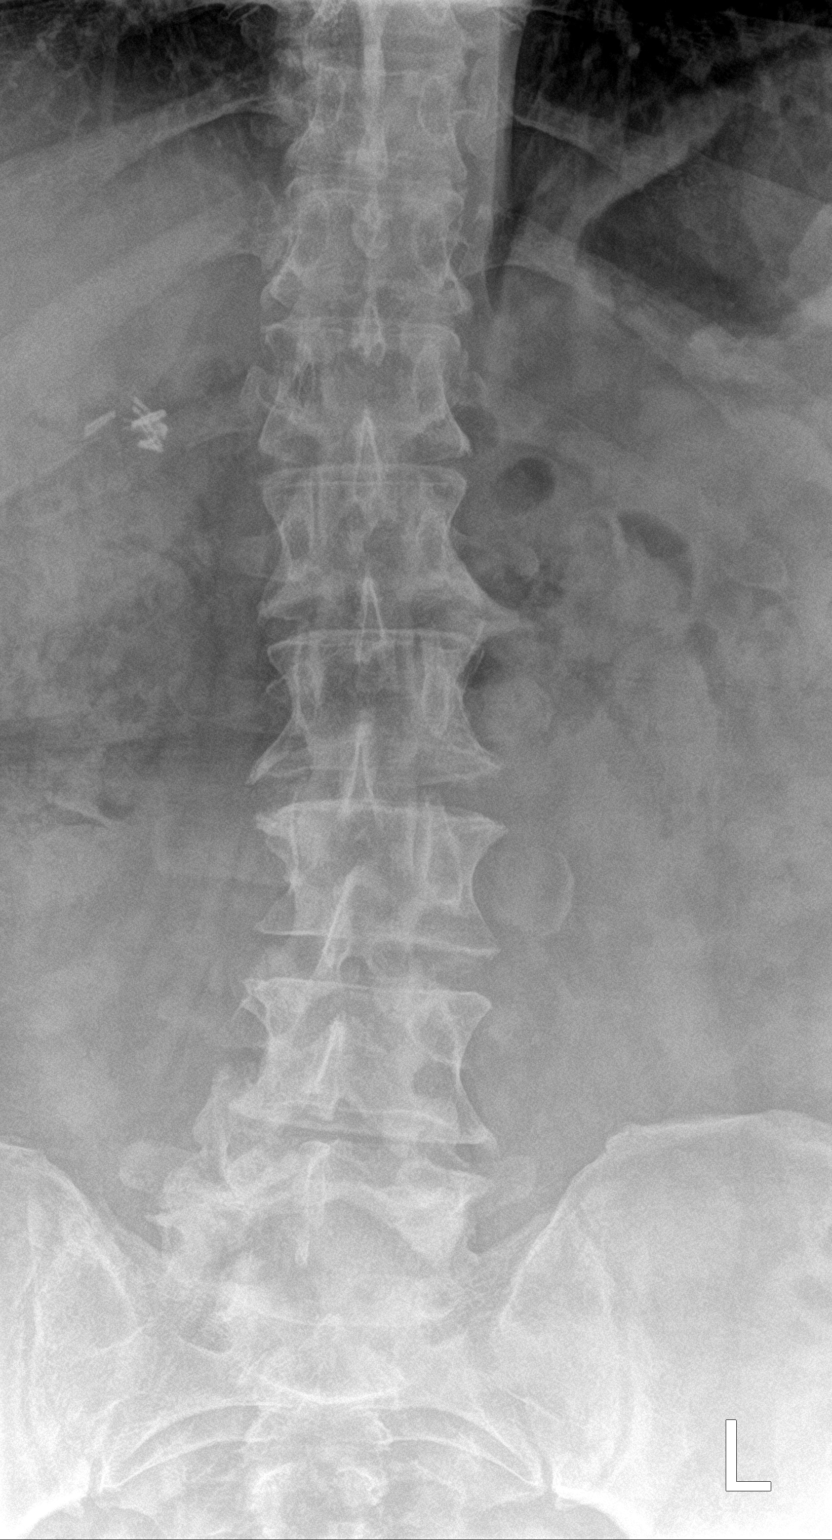

[l-spine lat]
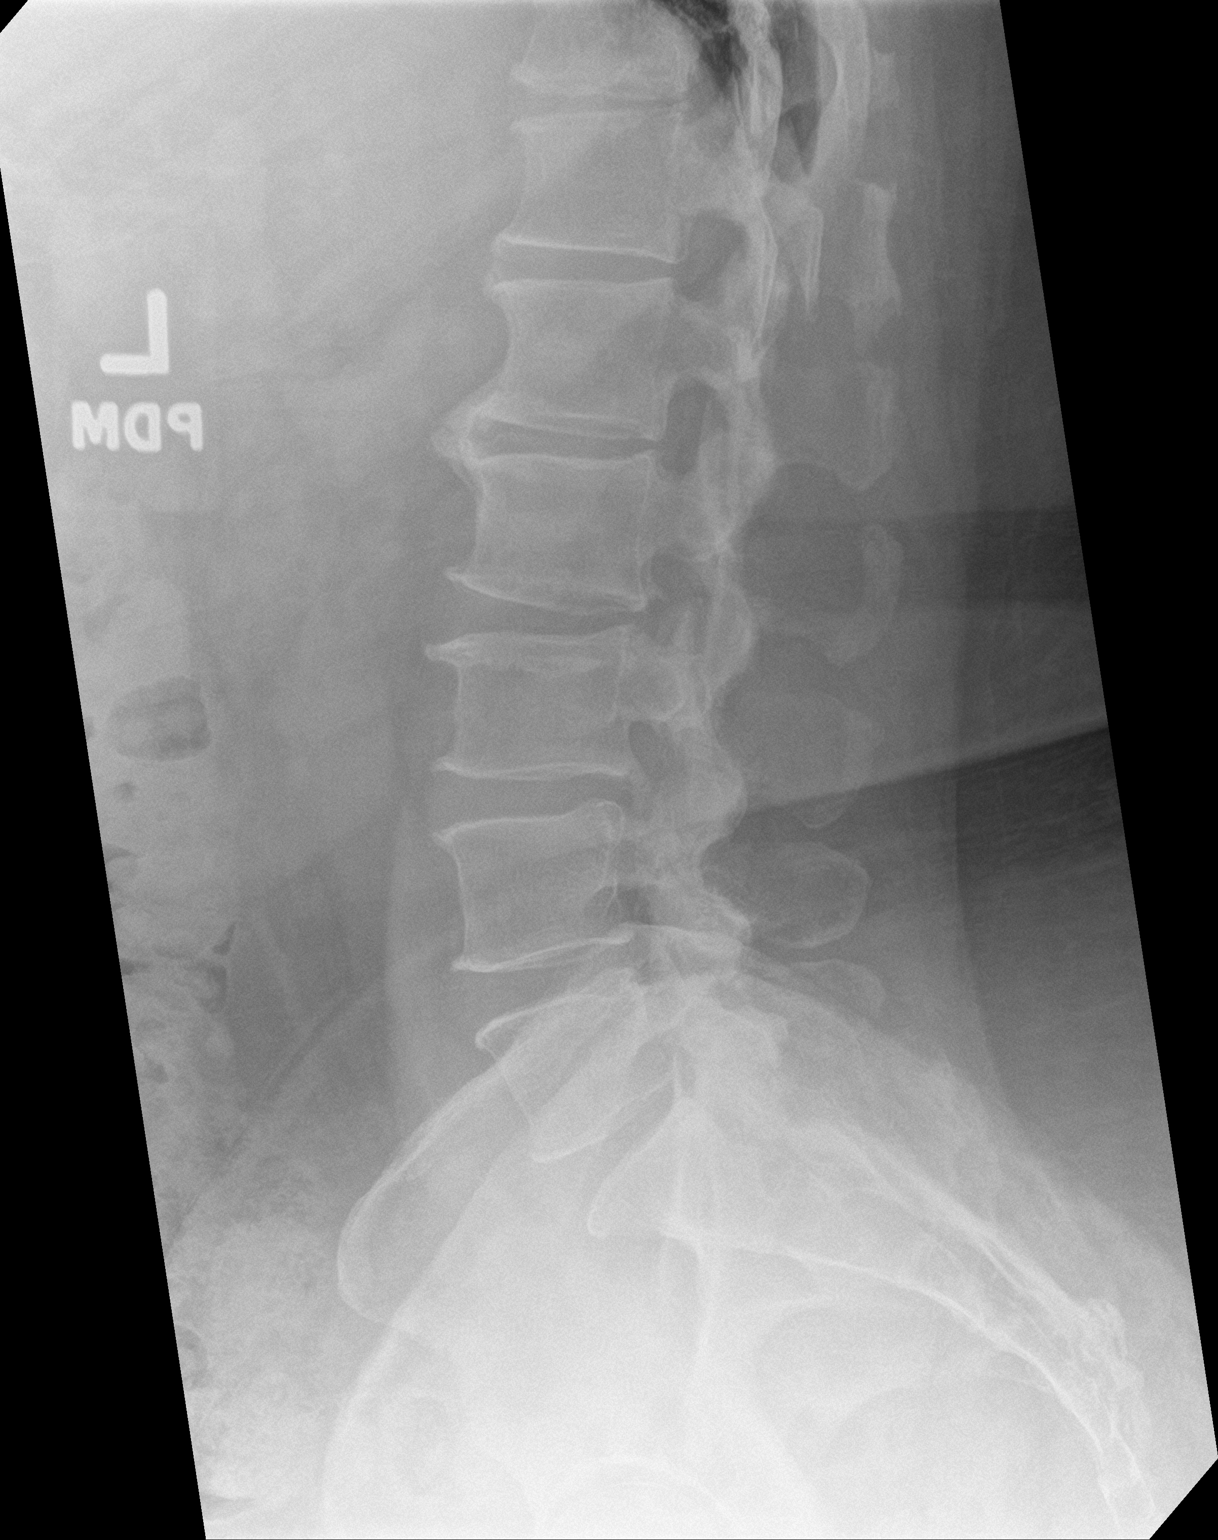

[l-spine spot]
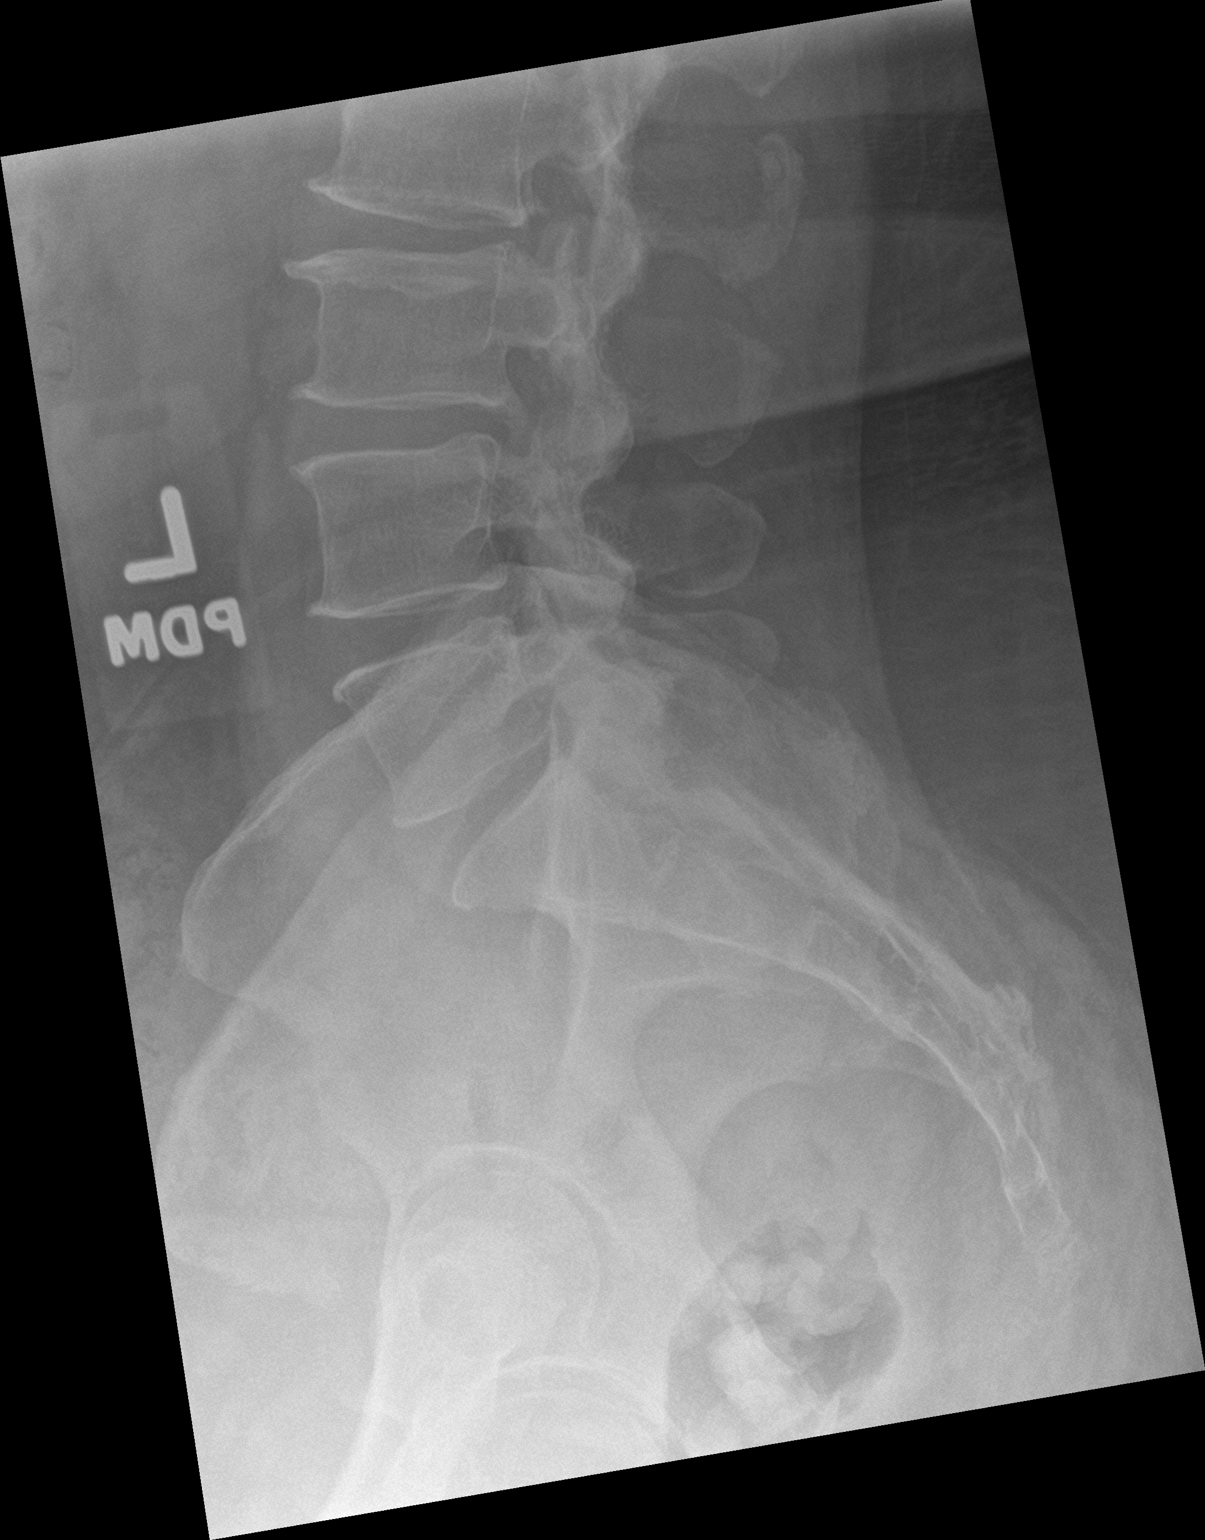

[3 of 3 positions shown; findings below may reference images not displayed]

FINDINGS: Mild curvature lumbar spine convex left.

L1-2 very mild disc space narrowing with anterior left lateral
osteophyte.

L2-3 very mild disc space narrowing with anterior and right lateral
osteophyte.

L3-4 posterior osteophyte without significant disc space narrowing.

L4-5 minimal posterior disc space narrowing.

L5-S1 minimal posterior disc space narrowing.

Post cholecystectomy.
IMPRESSION: Mild curvature lumbar spine convex left with minimal to mild
degenerative changes without significant disc space narrowing.

## 2024-08-16 ENCOUNTER — Other Ambulatory Visit (INDEPENDENT_AMBULATORY_CARE_PROVIDER_SITE_OTHER): Payer: Self-pay | Admitting: Family Medicine
